# Patient Record
Sex: Male | Born: 1955 | Race: White | Hispanic: No | Marital: Married
Health system: Midwestern US, Community
[De-identification: ages and names within clinical notes are randomized; demographics above are authoritative.]

## PROBLEM LIST (undated history)

## (undated) DIAGNOSIS — S92901D Unspecified fracture of right foot, subsequent encounter for fracture with routine healing: Secondary | ICD-10-CM

## (undated) DIAGNOSIS — G4733 Obstructive sleep apnea (adult) (pediatric): Secondary | ICD-10-CM

## (undated) DIAGNOSIS — L578 Other skin changes due to chronic exposure to nonionizing radiation: Secondary | ICD-10-CM

## (undated) DIAGNOSIS — I10 Essential (primary) hypertension: Secondary | ICD-10-CM

## (undated) DIAGNOSIS — Z021 Encounter for pre-employment examination: Secondary | ICD-10-CM

## (undated) DIAGNOSIS — E785 Hyperlipidemia, unspecified: Secondary | ICD-10-CM

## (undated) DIAGNOSIS — E7800 Pure hypercholesterolemia, unspecified: Secondary | ICD-10-CM

## (undated) DIAGNOSIS — C61 Malignant neoplasm of prostate: Secondary | ICD-10-CM

## (undated) DIAGNOSIS — Z1211 Encounter for screening for malignant neoplasm of colon: Secondary | ICD-10-CM

## (undated) DIAGNOSIS — J342 Deviated nasal septum: Secondary | ICD-10-CM

## (undated) DIAGNOSIS — S92901K Unspecified fracture of right foot, subsequent encounter for fracture with nonunion: Secondary | ICD-10-CM

## (undated) DIAGNOSIS — Z77018 Contact with and (suspected) exposure to other hazardous metals: Secondary | ICD-10-CM

## (undated) DIAGNOSIS — F419 Anxiety disorder, unspecified: Secondary | ICD-10-CM

## (undated) MED ORDER — LOVASTATIN 20 MG TAB
20 mg | ORAL_TABLET | Freq: Every evening | ORAL | Status: DC
Start: ? — End: 2013-08-10

## (undated) MED ORDER — LOSARTAN 50 MG TAB
50 mg | ORAL_TABLET | Freq: Every day | ORAL | Status: DC
Start: ? — End: 2013-08-10

## (undated) MED ORDER — MOMETASONE 0.1 % TOPICAL CREAM
0.1 % | Freq: Two times a day (BID) | CUTANEOUS | Status: DC
Start: ? — End: 2013-05-01

## (undated) MED ORDER — ZOLPIDEM 10 MG TAB
10 mg | ORAL_TABLET | Freq: Every evening | ORAL | Status: DC | PRN
Start: ? — End: 2013-08-10

## (undated) MED ORDER — LOSARTAN 50 MG TAB
50 mg | ORAL_TABLET | Freq: Every day | ORAL | Status: DC
Start: ? — End: 2013-05-01

## (undated) MED ORDER — ZOLPIDEM 10 MG TAB
10 mg | ORAL_TABLET | Freq: Every evening | ORAL | Status: DC | PRN
Start: ? — End: 2013-05-01

## (undated) MED ORDER — MOMETASONE 0.1 % TOPICAL CREAM
0.1 % | Freq: Two times a day (BID) | CUTANEOUS | Status: DC
Start: ? — End: 2016-11-26

## (undated) MED ORDER — LOSARTAN 50 MG TAB
50 mg | ORAL_TABLET | Freq: Every day | ORAL | Status: DC
Start: ? — End: 2014-09-20

## (undated) MED ORDER — LOVASTATIN 20 MG TAB
20 mg | ORAL_TABLET | Freq: Every evening | ORAL | Status: DC
Start: ? — End: 2014-09-20

## (undated) MED ORDER — ZOLPIDEM 10 MG TAB
10 mg | ORAL_TABLET | Freq: Every evening | ORAL | Status: DC | PRN
Start: ? — End: 2014-09-20

---

## 2013-04-10 NOTE — Progress Notes (Signed)
HISTORY OF PRESENT ILLNESS  Willie Stewart is a 57 y.o. male.  Chief Complaint   Patient presents with   ??? New Patient     been out of [place] Would like to schedule a complete physical soon.   ??? Medication Refill   ??? Immunization/Injection     Delines Influenza Vaccine Today   ??? Cholesterol Problem     borderline Cholesterol- tried and failed Lipitor myalgia's.   ??? Anemia     iron levels have been low. Takes OTC Iron supplement.     HPI  Patient presents today for follow-up of medical care.  He works for [employer] and travels [place].  He essentially lived in 6 different areas in the last 10 years.  He has been placed on Lipitor in the past for hyperlipidemia and mild coronary artery disease.  He had a calcium score 5 in 2012.  Nonetheless he has moved to [place] in the last 3 weeks and just flew into town at 20 PM yesterday evening.  He states he does have some right shoulder pain with exercise and has not been exercising for the last few weeks due to this.  His right shoulder pain is improved with no exercise.  He travels a great deal and does have difficulty with sleeping.  He does have trouble sleeping takes Ambien.  This is usually once a week.    No chest pain.  No shortness of breath.  He remains quite active.    Past Medical History   Diagnosis Date   ??? Hypertension    ??? Insomnia    ??? Atopic dermatitis      Past Surgical History   Procedure Laterality Date   ??? Hx hernia repair  1979   ??? Hx colonoscopy  2006     benign      Family History   Problem Relation Age of Onset   ??? Cancer Mother      breast and Leukemia   ??? Heart Disease Father      CAD   ??? Hypertension Father    ??? Cancer Father      Prostate     History   Substance Use Topics   ??? Smoking status: Never Smoker    ??? Smokeless tobacco: Never Used   ??? Alcohol Use: Yes      Comment: socially      Prior to Admission medications    Medication Sig Start Date End Date Taking? Authorizing Provider   b complex vitamins  tablet Take 1 tablet by mouth daily.   Yes Historical Provider   ascorbic acid (VITAMIN C) 500 mg tablet Take 1,000 mg by mouth daily.   Yes Historical Provider   cholecalciferol, vitamin D3, (VITAMIN D3) 2,000 unit tab Take  by mouth.   Yes Historical Provider   Glucosam-Chon-Collag-Hyalur Ac 375-300-50-2 mg cap Take  by mouth daily.   Yes Historical Provider   red yeast rice extract 600 mg cap Take 600 mg by mouth now.   Yes Historical Provider   ferrous sulfate (IRON) 325 mg (65 mg iron) tablet Take  by mouth Daily (before breakfast).   Yes Historical Provider   OTHER Apple Pectin takes 1 tablet once daily   Yes Historical Provider   coenzyme q10-vitamin e (COQ10 SG 100) 100-100 mg-unit cap Take  by mouth.   Yes Historical Provider   losartan (COZAAR) 50 mg tablet Take 1 tablet by mouth daily. 04/10/13  Yes Charlynn Court., MD   mometasone (ELOCON) 0.1 % topical cream Apply  to affected area two (2) times a day. Indications: ATOPIC DERMATITIS 04/10/13  Yes Charlynn Court., MD   zolpidem [facility]) 10 mg tablet Take 1 tablet by mouth nightly as needed for Sleep. 04/10/13  Yes Charlynn Court., MD        No Known Allergies    Review of Systems   Constitutional: Positive for malaise/fatigue. Negative for fever and chills.   HENT: Negative for hearing loss, sore throat and neck pain.    Respiratory: Negative for cough and shortness of breath.    Cardiovascular: Negative for chest pain and leg swelling.   Gastrointestinal: Negative for heartburn, nausea and vomiting.   Genitourinary: Negative for dysuria.   Musculoskeletal: Negative for myalgias.   Skin: Negative for rash (infrequent eczema).   Neurological: Positive for focal weakness (right arm/shoulder since he had a biceps tendon rupture). Negative for headaches.       BP 114/80   Pulse 72   Ht 5' 7.5" (1.715 m)   Wt 164 lb (74.39 kg)   BMI 25.29 kg/m2    Physical Exam   Constitutional: He is oriented to person, place, and time. He appears  well-developed and well-nourished. No distress.   HENT:   Head: Normocephalic and atraumatic.   Right Ear: External ear normal.   Left Ear: External ear normal.   Eyes: Conjunctivae and EOM are normal. Pupils are equal, round, and reactive to light. No scleral icterus.   Neck: Normal range of motion. Neck supple. No JVD present. No thyromegaly present.   Cardiovascular: Normal rate, regular rhythm, normal heart sounds and intact distal pulses.  Exam reveals no gallop and no friction rub.    No murmur heard.  Pulmonary/Chest: Effort normal and breath sounds normal. No respiratory distress. He has no wheezes. He has no rales.   Abdominal: Soft. He exhibits no distension.   Musculoskeletal: Normal range of motion. He exhibits no edema and no tenderness.   Lymphadenopathy:     He has no cervical adenopathy.   Neurological: He is alert and oriented to person, place, and time. No cranial nerve deficit. He exhibits normal muscle tone. Coordination normal.   Skin: Skin is warm and dry. No rash noted. He is not diaphoretic. No erythema. No pallor.   Psychiatric: He has a normal mood and affect. His behavior is normal. Judgment and thought content normal.     Jun 14, 2011  Total Chol 224 , LDL 93, HDL 65  PSA elevated 1.01 ug/l  May 2014   Hbg 13.7      ASSESSMENT and PLAN    ICD-9-CM    1. HTN (hypertension) 401.9 losartan (COZAAR) 50 mg tablet     CBC WITH AUTOMATED DIFF     METABOLIC PANEL, COMPREHENSIVE     TSH, 3RD GENERATION     LIPID PANEL     PROSTATE SPECIFIC AG (PSA)     URINALYSIS, COMPLETE   2. Hyperlipemia 272.4 red yeast rice extract 600 mg cap     CBC WITH AUTOMATED DIFF     METABOLIC PANEL, COMPREHENSIVE     LIPID PANEL   3. Anemia 285.9 b complex vitamins tablet     ascorbic acid (VITAMIN C) 500 mg tablet     ferrous sulfate (IRON) 325 mg (65 mg iron) tablet     VITAMIN B12 & FOLATE     FERRITIN  TRANSFERRIN SATURATION   4. Sleep disorder, circadian, shift work type 327.36 zolpidem (AMBIEN) 10 mg tablet    5. CAD (coronary artery disease) 414.00 red yeast rice extract 600 mg cap   6. Vitamin D deficiency 268.9 cholecalciferol, vitamin D3, (VITAMIN D3) 2,000 unit tab   7. Arthritis 716.90 Glucosam-Chon-Collag-Hyalur Ac 375-300-50-2 mg cap     Encounter Diagnoses   Name Primary?   ??? HTN (hypertension) Yes   ??? Hyperlipemia    ??? Anemia    ??? Sleep disorder, circadian, shift work type    ??? CAD (coronary artery disease)    ??? Vitamin D deficiency    ??? Arthritis      Orders Placed This Encounter   ??? CBC WITH AUTOMATED DIFF   ??? METABOLIC PANEL, COMPREHENSIVE   ??? TSH, 3RD GENERATION   ??? LIPID PANEL   ??? PSA   ??? Vitamin B12 Folate   ??? Ferritin   ??? IRON PROFILE (TRANSFERRIN SATURATION)   ??? Urinalysis- Complete   ??? DISCONTD: losartan (COZAAR) 50 mg tablet   ??? DISCONTD: mometasone (ELOCON) 0.1 % topical cream   ??? DISCONTD: zolpidem (AMBIEN) 10 mg tablet   ??? b complex vitamins tablet   ??? ascorbic acid (VITAMIN C) 500 mg tablet   ??? cholecalciferol, vitamin D3, (VITAMIN D3) 2,000 unit tab   ??? Glucosam-Chon-Collag-Hyalur Ac 375-300-50-2 mg cap   ??? red yeast rice extract 600 mg cap   ??? ferrous sulfate (IRON) 325 mg (65 mg iron) tablet   ??? OTHER   ??? coenzyme q10-vitamin e (COQ10 SG 100) 100-100 mg-unit cap   ??? losartan (COZAAR) 50 mg tablet   ??? mometasone (ELOCON) 0.1 % topical cream   ??? zolpidem (AMBIEN) 10 mg tablet     Nassim was seen today for new patient, medication refill, immunization/injection, cholesterol problem and anemia.    Diagnoses and associated orders for this visit:    HTN (hypertension)  - losartan (COZAAR) 50 mg tablet; Take 1 tablet by mouth daily.  - CBC WITH AUTOMATED DIFF; Future  - METABOLIC PANEL, COMPREHENSIVE; Future  - TSH, 3RD GENERATION; Future  - LIPID PANEL; Future  - PSA; Future  - Urinalysis- Complete; Future    Hyperlipemia  - red yeast rice extract 600 mg cap; Take 600 mg by mouth now.  - CBC WITH AUTOMATED DIFF; Future  - METABOLIC PANEL, COMPREHENSIVE; Future  - LIPID PANEL; Future    Anemia  - b  complex vitamins tablet; Take 1 tablet by mouth daily.  - ascorbic acid (VITAMIN C) 500 mg tablet; Take 1,000 mg by mouth daily.  - ferrous sulfate (IRON) 325 mg (65 mg iron) tablet; Take  by mouth Daily (before breakfast).  - Vitamin B12 Folate; Future  - Ferritin; Future  - IRON PROFILE (TRANSFERRIN SATURATION); Future    Sleep disorder, circadian, shift work type  - zolpidem (AMBIEN) 10 mg tablet; Take 1 tablet by mouth nightly as needed for Sleep.    CAD (coronary artery disease)  - red yeast rice extract 600 mg cap; Take 600 mg by mouth now.    Vitamin D deficiency  - cholecalciferol, vitamin D3, (VITAMIN D3) 2,000 unit tab; Take  by mouth.    Arthritis  - Glucosam-Chon-Collag-Hyalur Ac 375-300-50-2 mg cap; Take  by mouth daily.    Other Orders  - Discontinue: losartan (COZAAR) 50 mg tablet; Take  by mouth daily.  - Discontinue: mometasone (ELOCON) 0.1 % topical cream; Apply  to affected area two (2)  times a day. Indications: ATOPIC DERMATITIS  - Discontinue: zolpidem (AMBIEN) 10 mg tablet; Take  by mouth nightly as needed for Sleep.  - OTHER; Apple Pectin takes 1 tablet once daily  - coenzyme q10-vitamin e (COQ10 SG 100) 100-100 mg-unit cap; Take  by mouth.  - mometasone (ELOCON) 0.1 % topical cream; Apply  to affected area two (2) times a day. Indications: ATOPIC DERMATITIS        Follow-up Disposition: Not on File

## 2013-04-13 LAB — METABOLIC PANEL, COMPREHENSIVE
A-G Ratio: 2 (ref 1.1–2.5)
ALT (SGPT): 23 IU/L (ref 0–44)
AST (SGOT): 20 IU/L (ref 0–40)
Albumin: 4.6 g/dL (ref 3.5–5.5)
Alk. phosphatase: 43 IU/L (ref 39–117)
BUN/Creatinine ratio: 19 (ref 9–20)
BUN: 23 mg/dL (ref 6–24)
Bilirubin, total: 0.6 mg/dL (ref 0.0–1.2)
CO2: 26 mmol/L (ref 18–29)
Calcium: 9.2 mg/dL (ref 8.7–10.2)
Chloride: 99 mmol/L (ref 97–108)
Creatinine: 1.2 mg/dL (ref 0.76–1.27)
GFR est AA: 77 mL/min/{1.73_m2} (ref >59)
GFR est non-AA: 66 mL/min/{1.73_m2} (ref >59)
GLOBULIN, TOTAL: 2.3 g/dL (ref 1.5–4.5)
Glucose: 90 mg/dL (ref 65–99)
Potassium: 4.4 mmol/L (ref 3.5–5.2)
Protein, total: 6.9 g/dL (ref 6.0–8.5)
Sodium: 139 mmol/L (ref 134–144)

## 2013-04-13 LAB — CBC WITH AUTOMATED DIFF
ABS. BASOPHILS: 0 10*3/uL (ref 0.0–0.2)
ABS. EOSINOPHILS: 0.1 10*3/uL (ref 0.0–0.4)
ABS. IMM. GRANS.: 0 10*3/uL (ref 0.0–0.1)
ABS. MONOCYTES: 0.5 10*3/uL (ref 0.1–0.9)
ABS. NEUTROPHILS: 2.9 10*3/uL (ref 1.4–7.0)
Abs Lymphocytes: 1.8 10*3/uL (ref 0.7–3.1)
BASOPHILS: 0 %
EOSINOPHILS: 2 %
HCT: 42.4 % (ref 37.5–51.0)
HGB: 13.8 g/dL (ref 12.6–17.7)
IMMATURE GRANULOCYTES: 0 %
Lymphocytes: 33 %
MCH: 30.1 pg (ref 26.6–33.0)
MCHC: 32.5 g/dL (ref 31.5–35.7)
MCV: 93 fL (ref 79–97)
MONOCYTES: 10 %
NEUTROPHILS: 55 %
PLATELET: 193 10*3/uL (ref 150–379)
RBC: 4.58 x10E6/uL (ref 4.14–5.80)
RDW: 13.7 % (ref 12.3–15.4)
WBC: 5.3 10*3/uL (ref 3.4–10.8)

## 2013-04-13 LAB — VITAMIN D, 25 HYDROXY: VITAMIN D, 25-HYDROXY: 37.1 ng/mL (ref 30.0–100.0)

## 2013-04-13 LAB — TSH 3RD GENERATION: TSH: 2.36 u[IU]/mL (ref 0.450–4.500)

## 2013-04-13 LAB — LIPID PANEL
Cholesterol, total: 252 mg/dL — ABNORMAL HIGH (ref 100–199)
HDL Cholesterol: 69 mg/dL (ref >39)
LDL, calculated: 147 mg/dL — ABNORMAL HIGH (ref 0–99)
Triglyceride: 179 mg/dL — ABNORMAL HIGH (ref 0–149)
VLDL, calculated: 36 mg/dL (ref 5–40)

## 2013-04-13 LAB — PSA, DIAGNOSTIC (PROSTATE SPECIFIC AG): Prostate Specific Ag: 2.5 ng/mL (ref 0.0–4.0)

## 2013-04-13 LAB — FERRITIN: Ferritin: 514 ng/mL — ABNORMAL HIGH (ref 30–400)

## 2013-04-13 LAB — CVD REPORT: PDF IMAGE: 0

## 2013-04-13 LAB — VITAMIN B12 & FOLATE
Folate: 18.2 ng/mL (ref >3.0)
Vitamin B12: 782 pg/mL (ref 211–946)

## 2013-05-01 NOTE — Patient Instructions (Addendum)
Results for orders placed in visit on 04/12/13   CBC WITH AUTOMATED DIFF       Result Value Range    WBC 5.3  3.4 - 10.8 x10E3/uL    RBC 4.58  4.14 - 5.80 x10E6/uL    HGB 13.8  12.6 - 17.7 g/dL    HCT 16.1  09.6 - 04.5 %    MCV 93  79 - 97 fL    MCH 30.1  26.6 - 33.0 pg    MCHC 32.5  31.5 - 35.7 g/dL    RDW 40.9  81.1 - 91.4 %    PLATELET 193  150 - 379 x10E3/uL    NEUTROPHILS 55      Lymphocytes 33      MONOCYTES 10      EOSINOPHILS 2      BASOPHILS 0      ABS. NEUTROPHILS 2.9  1.4 - 7.0 x10E3/uL    Abs Lymphocytes 1.8  0.7 - 3.1 x10E3/uL    ABS. MONOCYTES 0.5  0.1 - 0.9 x10E3/uL    ABS. EOSINOPHILS 0.1  0.0 - 0.4 x10E3/uL    ABS. BASOPHILS 0.0  0.0 - 0.2 x10E3/uL    IMMATURE GRANULOCYTES 0      ABS. IMM. GRANS. 0.0  0.0 - 0.1 x10E3/uL   METABOLIC PANEL, COMPREHENSIVE       Result Value Range    Glucose 90  65 - 99 mg/dL    BUN 23  6 - 24 mg/dL    Creatinine 7.82  9.56 - 1.27 mg/dL    GFR est non-AA 66  >21 mL/min/1.73    GFR est AA 77  >59 mL/min/1.73    BUN/Creatinine ratio 19  9 - 20    Sodium 139  134 - 144 mmol/L    Potassium 4.4  3.5 - 5.2 mmol/L    Chloride 99  97 - 108 mmol/L    CO2 26  18 - 29 mmol/L    Calcium 9.2  8.7 - 10.2 mg/dL    Protein, total 6.9  6.0 - 8.5 g/dL    Albumin 4.6  3.5 - 5.5 g/dL    GLOBULIN, TOTAL 2.3  1.5 - 4.5 g/dL    A-G Ratio 2.0  1.1 - 2.5    Bilirubin, total 0.6  0.0 - 1.2 mg/dL    Alk. phosphatase 43  39 - 117 IU/L    AST 20  0 - 40 IU/L    ALT 23  0 - 44 IU/L   TSH, 3RD GENERATION       Result Value Range    TSH 2.360  0.450 - 4.500 uIU/mL   LIPID PANEL       Result Value Range    Cholesterol, total 252 (*) 100 - 199 mg/dL    Triglyceride 308 (*) 0 - 149 mg/dL    HDL Cholesterol 69  >39 mg/dL    VLDL, calculated 36  5 - 40 mg/dL    LDL, calculated 657 (*) 0 - 99 mg/dL   PROSTATE SPECIFIC AG (PSA)       Result Value Range    Prostate Specific Ag 2.5  0.0 - 4.0 ng/mL   VITAMIN B12 & FOLATE       Result Value Range    Vitamin B12 782  211 - 946 pg/mL    Folate 18.2  >3.0 ng/mL    FERRITIN       Result Value Range    Ferritin 514 (*)  30 - 400 ng/mL   VITAMIN D, 25 HYDROXY       Result Value Range    VITAMIN D, 25-HYDROXY 37.1  30.0 - 100.0 ng/mL   CVD REPORT       Result Value Range    INTERPRETATION Note      PDF IMAGE .           Thrusters light weight.    Push-ups hands off the ground.  Upright rows.  Lat pull down to front.    External rotations.      Labs work in 3 months, 1 week before appt.

## 2013-05-01 NOTE — Progress Notes (Signed)
HISTORY OF PRESENT ILLNESS  Willie Stewart is a 57 y.o. male.  Chief Complaint   Patient presents with   ??? Complete Physical     no compliants/ needs Form for work in able to work out.   ??? Immunization/Injection     Declines Influenza Vaccine today     HPI  Patient comes in today for yearly physical.  He is doing well.  His right shoulder is still problematic but since he is not working out it is feeling less painful.  He is now scheduled to fly to [place] in the coming months.  No chest pain.  No sob.        Past Medical History   Diagnosis Date   ??? Hypertension    ??? Insomnia    ??? Atopic dermatitis      Past Surgical History   Procedure Laterality Date   ??? Hx hernia repair  1979   ??? Hx colonoscopy  2006     benign      Family History   Problem Relation Age of Onset   ??? Cancer Mother      breast and Leukemia   ??? Heart Disease Father      CAD   ??? Hypertension Father    ??? Cancer Father      Prostate     History   Substance Use Topics   ??? Smoking status: Never Smoker    ??? Smokeless tobacco: Never Used   ??? Alcohol Use: Yes      Comment: socially      Prior to Admission medications    Medication Sig Start Date End Date Taking? Authorizing Provider   mometasone (ELOCON) 0.1 % topical cream Apply  to affected area two (2) times a day. Indications: ATOPIC DERMATITIS 05/01/13  Yes Charlynn Court., MD   zolpidem [facility]) 10 mg tablet Take 1 tablet by mouth nightly as needed for Sleep. 05/01/13  Yes Charlynn Court., MD   losartan (COZAAR) 50 mg tablet Take 1 tablet by mouth daily. 05/01/13  Yes Charlynn Court., MD   lovastatin (MEVACOR) 20 mg tablet Take 1 tablet by mouth nightly. 05/01/13  Yes Charlynn Court., MD   b complex vitamins tablet Take 1 tablet by mouth daily.   Yes Historical Provider   ascorbic acid (VITAMIN C) 500 mg tablet Take 1,000 mg by mouth daily.   Yes Historical Provider   cholecalciferol, vitamin D3, (VITAMIN D3) 2,000 unit tab Take  by mouth.   Yes Historical Provider    Glucosam-Chon-Collag-Hyalur Ac 375-300-50-2 mg cap Take  by mouth daily.   Yes Historical Provider   ferrous sulfate (IRON) 325 mg (65 mg iron) tablet Take  by mouth Daily (before breakfast).   Yes Historical Provider   OTHER Apple Pectin takes 1 tablet once daily   Yes Historical Provider   coenzyme q10-vitamin e (COQ10 SG 100) 100-100 mg-unit cap Take  by mouth.   Yes Historical Provider        No Known Allergies      Review of Systems   Constitutional: Negative for fever and weight loss.   Respiratory: Negative for cough and shortness of breath.    Cardiovascular: Negative for chest pain and palpitations.   Gastrointestinal: Negative for heartburn, nausea and vomiting.   Genitourinary: Negative for dysuria.   Musculoskeletal: Positive for joint pain. Negative for myalgias and back pain.   Skin: Negative for rash.   Neurological:  Negative for dizziness and headaches.   Psychiatric/Behavioral: Negative for depression.       BP 110/80   Pulse 60   Ht 5' 7.5" (1.715 m)   Wt 163 lb (73.936 kg)   BMI 25.14 kg/m2    Physical Exam   Nursing note and vitals reviewed.  Constitutional: He is oriented to person, place, and time. He appears well-developed and well-nourished. No distress.   HENT:   Head: Normocephalic and atraumatic.   Mouth/Throat: Oropharynx is clear and moist. No oropharyngeal exudate.   Eyes: Conjunctivae are normal. Pupils are equal, round, and reactive to light. Right eye exhibits no discharge. Left eye exhibits no discharge. No scleral icterus.   Neck: Normal range of motion. Neck supple. No tracheal deviation present. No thyromegaly present.   Cardiovascular: Normal rate, regular rhythm, normal heart sounds and intact distal pulses.  Exam reveals no gallop and no friction rub.    No murmur heard.  Pulmonary/Chest: Effort normal. No respiratory distress. He has no wheezes. He has no rales.   Abdominal: Soft. Bowel sounds are normal. He exhibits no distension. There is no tenderness. There is no  rebound.   Genitourinary: Rectum normal, prostate normal and penis normal. Guaiac negative stool.   Musculoskeletal: Normal range of motion. He exhibits no edema.        Arms:  Deformity of the right shoulder.   Lymphadenopathy:     He has no cervical adenopathy.   Neurological: He is alert and oriented to person, place, and time. He has normal reflexes. No cranial nerve deficit. Coordination normal.   Skin: Skin is warm and dry. He is not diaphoretic.   Psychiatric: He has a normal mood and affect. His behavior is normal. Judgment and thought content normal.         Results for orders placed in visit on 04/12/13   CBC WITH AUTOMATED DIFF       Result Value Range    WBC 5.3  3.4 - 10.8 x10E3/uL    RBC 4.58  4.14 - 5.80 x10E6/uL    HGB 13.8  12.6 - 17.7 g/dL    HCT 96.0  45.4 - 09.8 %    MCV 93  79 - 97 fL    MCH 30.1  26.6 - 33.0 pg    MCHC 32.5  31.5 - 35.7 g/dL    RDW 11.9  14.7 - 82.9 %    PLATELET 193  150 - 379 x10E3/uL    NEUTROPHILS 55      Lymphocytes 33      MONOCYTES 10      EOSINOPHILS 2      BASOPHILS 0      ABS. NEUTROPHILS 2.9  1.4 - 7.0 x10E3/uL    Abs Lymphocytes 1.8  0.7 - 3.1 x10E3/uL    ABS. MONOCYTES 0.5  0.1 - 0.9 x10E3/uL    ABS. EOSINOPHILS 0.1  0.0 - 0.4 x10E3/uL    ABS. BASOPHILS 0.0  0.0 - 0.2 x10E3/uL    IMMATURE GRANULOCYTES 0      ABS. IMM. GRANS. 0.0  0.0 - 0.1 x10E3/uL   METABOLIC PANEL, COMPREHENSIVE       Result Value Range    Glucose 90  65 - 99 mg/dL    BUN 23  6 - 24 mg/dL    Creatinine 5.62  1.30 - 1.27 mg/dL    GFR est non-AA 66  >86 mL/min/1.73    GFR est AA 77  >59 mL/min/1.73  BUN/Creatinine ratio 19  9 - 20    Sodium 139  134 - 144 mmol/L    Potassium 4.4  3.5 - 5.2 mmol/L    Chloride 99  97 - 108 mmol/L    CO2 26  18 - 29 mmol/L    Calcium 9.2  8.7 - 10.2 mg/dL    Protein, total 6.9  6.0 - 8.5 g/dL    Albumin 4.6  3.5 - 5.5 g/dL    GLOBULIN, TOTAL 2.3  1.5 - 4.5 g/dL    A-G Ratio 2.0  1.1 - 2.5    Bilirubin, total 0.6  0.0 - 1.2 mg/dL    Alk. phosphatase 43  39 - 117 IU/L     AST 20  0 - 40 IU/L    ALT 23  0 - 44 IU/L   TSH, 3RD GENERATION       Result Value Range    TSH 2.360  0.450 - 4.500 uIU/mL   LIPID PANEL       Result Value Range    Cholesterol, total 252 (*) 100 - 199 mg/dL    Triglyceride 161 (*) 0 - 149 mg/dL    HDL Cholesterol 69  >39 mg/dL    VLDL, calculated 36  5 - 40 mg/dL    LDL, calculated 096 (*) 0 - 99 mg/dL   PROSTATE SPECIFIC AG (PSA)       Result Value Range    Prostate Specific Ag 2.5  0.0 - 4.0 ng/mL   VITAMIN B12 & FOLATE       Result Value Range    Vitamin B12 782  211 - 946 pg/mL    Folate 18.2  >3.0 ng/mL   FERRITIN       Result Value Range    Ferritin 514 (*) 30 - 400 ng/mL   VITAMIN D, 25 HYDROXY       Result Value Range    VITAMIN D, 25-HYDROXY 37.1  30.0 - 100.0 ng/mL   CVD REPORT       Result Value Range    INTERPRETATION Note      PDF IMAGE .       EKG: sinus bradycardia, rate 58 bpm, normal axis.      ASSESSMENT and PLAN    ICD-9-CM    1. Routine general medical examination at a health care facility V70.0 AMB POC EKG ROUTINE W/ 12 LEADS, INTER & REP     AMB POC FECAL BLOOD, OCCULT, QL 1 CARD   2. Anemia 285.9 AMB POC FECAL BLOOD, OCCULT, QL 1 CARD   3. Sleep disorder, circadian, shift work type 327.36 zolpidem (AMBIEN) 10 mg tablet   4. HTN (hypertension) 401.9 losartan (COZAAR) 50 mg tablet   5. Hyperlipemia 272.4 lovastatin (MEVACOR) 20 mg tablet     LIPID PANEL     Encounter Diagnoses   Name Primary?   ??? Routine general medical examination at a health care facility Yes   ??? Anemia    ??? Sleep disorder, circadian, shift work type    ??? HTN (hypertension)    ??? Hyperlipemia      Orders Placed This Encounter   ??? LIPID PANEL   ??? Hemoccult, Single Specimen   ??? EKG w/ 12 leads, Inter_rep   ??? mometasone (ELOCON) 0.1 % topical cream   ??? zolpidem (AMBIEN) 10 mg tablet   ??? losartan (COZAAR) 50 mg tablet   ??? lovastatin (MEVACOR) 20 mg tablet     Willie Stewart was seen  today for complete physical and immunization/injection.    Diagnoses and associated orders for this  visit:    Routine general medical examination at a health care facility  - EKG w/ 12 leads, Inter_rep  - Hemoccult, Single Specimen    Anemia  - Hemoccult, Single Specimen    Sleep disorder, circadian, shift work type  - zolpidem (AMBIEN) 10 mg tablet; Take 1 tablet by mouth nightly as needed for Sleep.    HTN (hypertension)  - losartan (COZAAR) 50 mg tablet; Take 1 tablet by mouth daily.    Hyperlipemia  - lovastatin (MEVACOR) 20 mg tablet; Take 1 tablet by mouth nightly.  - LIPID PANEL; Future    Other Orders  - mometasone (ELOCON) 0.1 % topical cream; Apply  to affected area two (2) times a day. Indications: ATOPIC DERMATITIS        Follow-up Disposition:  Return in about 3 months (around 08/01/2013).    We discussed the laboratory values.  He has had significant problems with statin intolerance but is tolerating Red Yeast Rice which has on average less than 5 mg of lovastatin.  Will need statin for secondary prevention.  I gave some exercises to help with his shoulder.  Consider physical therapy.    Recheck cholesterol in 3 months on Mevacor 20 mg alone.  (Was on Lipitor in past with poor tolerance.)

## 2013-08-01 ENCOUNTER — Encounter

## 2013-08-03 ENCOUNTER — Telehealth

## 2013-08-03 LAB — URINALYSIS W/ RFLX MICROSCOPIC
Bilirubin: NEGATIVE
Blood: NEGATIVE
Glucose: NEGATIVE
Ketone: NEGATIVE
Leukocyte Esterase: NEGATIVE
Nitrites: NEGATIVE
Protein: NEGATIVE
Specific Gravity: 1.008 (ref 1.005–1.030)
Urobilinogen: 0.2 mg/dL (ref 0.0–1.9)
pH (UA): 6.5 (ref 5.0–7.5)

## 2013-08-03 LAB — CBC WITH AUTOMATED DIFF
ABS. BASOPHILS: 0 10*3/uL (ref 0.0–0.2)
ABS. EOSINOPHILS: 0 10*3/uL (ref 0.0–0.4)
ABS. IMM. GRANS.: 0 10*3/uL (ref 0.0–0.1)
ABS. MONOCYTES: 0.5 10*3/uL (ref 0.1–0.9)
ABS. NEUTROPHILS: 2.9 10*3/uL (ref 1.4–7.0)
Abs Lymphocytes: 1.5 10*3/uL (ref 0.7–3.1)
BASOPHILS: 1 %
EOSINOPHILS: 0 %
HCT: 40 % (ref 37.5–51.0)
HGB: 13.8 g/dL (ref 12.6–17.7)
IMMATURE GRANULOCYTES: 0 %
Lymphocytes: 31 %
MCH: 30.5 pg (ref 26.6–33.0)
MCHC: 34.5 g/dL (ref 31.5–35.7)
MCV: 89 fL (ref 79–97)
MONOCYTES: 10 %
NEUTROPHILS: 58 %
PLATELET: 204 10*3/uL (ref 150–379)
RBC: 4.52 x10E6/uL (ref 4.14–5.80)
RDW: 13.6 % (ref 12.3–15.4)
WBC: 5 10*3/uL (ref 3.4–10.8)

## 2013-08-03 LAB — METABOLIC PANEL, COMPREHENSIVE
A-G Ratio: 2.5 (ref 1.1–2.5)
ALT (SGPT): 29 IU/L (ref 0–44)
AST (SGOT): 30 IU/L (ref 0–40)
Albumin: 4.5 g/dL (ref 3.5–5.5)
Alk. phosphatase: 42 IU/L (ref 39–117)
BUN/Creatinine ratio: 15 (ref 9–20)
BUN: 17 mg/dL (ref 6–24)
Bilirubin, total: 0.9 mg/dL (ref 0.0–1.2)
CO2: 26 mmol/L (ref 18–29)
Calcium: 9 mg/dL (ref 8.7–10.2)
Chloride: 102 mmol/L (ref 97–108)
Creatinine: 1.1 mg/dL (ref 0.76–1.27)
GFR est AA: 85 mL/min/{1.73_m2} (ref >59)
GFR est non-AA: 74 mL/min/{1.73_m2} (ref >59)
GLOBULIN, TOTAL: 1.8 g/dL (ref 1.5–4.5)
Glucose: 94 mg/dL (ref 65–99)
Potassium: 5.1 mmol/L (ref 3.5–5.2)
Protein, total: 6.3 g/dL (ref 6.0–8.5)
Sodium: 140 mmol/L (ref 134–144)

## 2013-08-03 LAB — CVD REPORT: PDF IMAGE: 0

## 2013-08-03 LAB — LIPID PANEL
Cholesterol, total: 199 mg/dL (ref 100–199)
HDL Cholesterol: 74 mg/dL (ref >39)
LDL, calculated: 107 mg/dL — ABNORMAL HIGH (ref 0–99)
Triglyceride: 90 mg/dL (ref 0–149)
VLDL, calculated: 18 mg/dL (ref 5–40)

## 2013-08-03 NOTE — Telephone Encounter (Signed)
Patient notified and he stated we can use the the EKG from October. He will go to have a chest X-Ray soon.

## 2013-08-03 NOTE — Telephone Encounter (Signed)
For pre-employment exam please obtain a chest xray.  Ordered.    Also, if you could see an Optometrist ( or ophthalmologist) so that I can fill out the vision section I would appreciate that.      We will order the EKG and due spirometry when you come in.

## 2013-08-03 NOTE — Progress Notes (Signed)
Quick Note:    I will review the labs at the upcoming appointment.  ______

## 2013-08-04 NOTE — Progress Notes (Signed)
Quick Note:    I will review the labs at the upcoming appointment.  ______

## 2013-08-07 NOTE — Progress Notes (Signed)
Quick Note:    I will review the labs at the upcoming appointment.  ______

## 2013-08-10 NOTE — Progress Notes (Signed)
HISTORY OF PRESENT ILLNESS  Willie Stewart is a 58 y.o. male.  Chief Complaint   Patient presents with   ??? Follow-up     3 month, discuss labs drawn 08-02-13   ??? Medication Refill     need 6 month supply since traveling to [place] for 18 months for business, leaving late Feb. 2015   ??? Immunization/Injection     questions about recommended current vaccinations for travel to [place]      HPI  Patient here for pre-international travel physical..    I filled out forms for his work at [employer] and travel abroad.      He denies any chest pain.  No shortness of breath.      He recently purchased a town home in subdivision of [place].      Review of Systems   Constitutional: Negative for fever and chills.   Respiratory: Negative for cough and shortness of breath.    Cardiovascular: Negative for chest pain.   Gastrointestinal: Negative for heartburn, nausea and vomiting.   Genitourinary: Negative for dysuria.   Musculoskeletal: Positive for myalgias (Mild since being on the Mevacor but noticable) and joint pain (Mild since being on the Mevacor but noticable).       BP 132/80   Pulse 72   Temp(Src) 98.5 ??F (36.9 ??C) (Oral)   Resp 16   Ht 5' 7.5" (1.715 m)   Wt 164 lb (74.39 kg)   BMI 25.29 kg/m2    Physical Exam   Nursing note and vitals reviewed.  Constitutional: He is oriented to person, place, and time. He appears well-developed and well-nourished. No distress.   HENT:   Head: Normocephalic and atraumatic.   Mouth/Throat: No oropharyngeal exudate.   Eyes: Conjunctivae are normal. Pupils are equal, round, and reactive to light. No scleral icterus.   Neck: Normal range of motion. Neck supple. No JVD present. No thyromegaly present.   Cardiovascular: Normal rate, regular rhythm, normal heart sounds and intact distal pulses.  Exam reveals no gallop and no friction rub.    No murmur heard.  Pulmonary/Chest: Effort normal and breath sounds normal. No respiratory distress. He has no wheezes.   Abdominal: Soft. Bowel sounds are  normal. He exhibits no distension. There is no tenderness. There is no rebound and no guarding.   Musculoskeletal: Normal range of motion. He exhibits no edema and no tenderness.   Lymphadenopathy:     He has no cervical adenopathy.   Neurological: He is alert and oriented to person, place, and time. No cranial nerve deficit.   Skin: Skin is warm and dry. He is not diaphoretic.   Psychiatric: He has a normal mood and affect. His behavior is normal. Thought content normal.       Results for orders placed in visit on 08/02/13   LIPID PANEL       Result Value Range    Cholesterol, total 199  100 - 199 mg/dL    Triglyceride 90  0 - 149 mg/dL    HDL Cholesterol 74  >39 mg/dL    VLDL, calculated 18  5 - 40 mg/dL    LDL, calculated 107 (*) 0 - 99 mg/dL   CBC WITH AUTOMATED DIFF       Result Value Range    WBC 5.0  3.4 - 10.8 x10E3/uL    RBC 4.52  4.14 - 5.80 x10E6/uL    HGB 13.8  12.6 - 17.7 g/dL    HCT 40.0  37.5 -  51.0 %    MCV 89  79 - 97 fL    MCH 30.5  26.6 - 33.0 pg    MCHC 34.5  31.5 - 35.7 g/dL    RDW 13.6  12.3 - 15.4 %    PLATELET 204  150 - 379 x10E3/uL    NEUTROPHILS 58      Lymphocytes 31      MONOCYTES 10      EOSINOPHILS 0      BASOPHILS 1      ABS. NEUTROPHILS 2.9  1.4 - 7.0 x10E3/uL    Abs Lymphocytes 1.5  0.7 - 3.1 x10E3/uL    ABS. MONOCYTES 0.5  0.1 - 0.9 x10E3/uL    ABS. EOSINOPHILS 0.0  0.0 - 0.4 x10E3/uL    ABS. BASOPHILS 0.0  0.0 - 0.2 x10E3/uL    IMMATURE GRANULOCYTES 0      ABS. IMM. GRANS. 0.0  0.0 - 0.1 x10E3/uL   URINALYSIS W/ RFLX MICROSCOPIC       Result Value Range    Specific Gravity 1.008  1.005 - 1.030    pH (UA) 6.5  5.0 - 7.5    Color Yellow  Yellow    Appearance Clear  Clear    Leukocyte Esterase Negative  Negative    Protein Negative  Negative/Trace    Glucose Negative  Negative    Ketone Negative  Negative    Blood Negative  Negative    Bilirubin Negative  Negative    Urobilinogen 0.2  0.0 - 1.9 mg/dL    Nitrites Negative  Negative    Microscopic Examination Comment     METABOLIC  PANEL, COMPREHENSIVE       Result Value Range    Glucose 94  65 - 99 mg/dL    BUN 17  6 - 24 mg/dL    Creatinine 1.10  0.76 - 1.27 mg/dL    GFR est non-AA 74  >59 mL/min/1.73    GFR est AA 85  >59 mL/min/1.73    BUN/Creatinine ratio 15  9 - 20    Sodium 140  134 - 144 mmol/L    Potassium 5.1  3.5 - 5.2 mmol/L    Chloride 102  97 - 108 mmol/L    CO2 26  18 - 29 mmol/L    Calcium 9.0  8.7 - 10.2 mg/dL    Protein, total 6.3  6.0 - 8.5 g/dL    Albumin 4.5  3.5 - 5.5 g/dL    GLOBULIN, TOTAL 1.8  1.5 - 4.5 g/dL    A-G Ratio 2.5  1.1 - 2.5    Bilirubin, total 0.9  0.0 - 1.2 mg/dL    Alk. phosphatase 42  39 - 117 IU/L    AST 30  0 - 40 IU/L    ALT 29  0 - 44 IU/L   CVD REPORT       Result Value Range    INTERPRETATION Note      PDF IMAGE .       XR Results (most recent):    Results from Hospital Encounter encounter on 08/04/13   XR CHEST PA LAT   Narrative PA LATERAL CHEST X-RAY    HISTORY: Preemployment physical. Hypertension.    COMPARISON: None.    FINDINGS: The heart size is normal. There is no lobar consolidation, pleural  effusions or pulmonary edema.         Impression IMPRESSION: No acute findings.        Spirometry  FEV 1 / FVC = 72%  FEV 1 =  3.41 L  (101%)  FVC =  4.76 L  (108%)      ASSESSMENT and PLAN    ICD-9-CM    1. Routine general medical examination at a health care facility V70.0 AMB POC SPIROMETRY   2. Sleep disorder, circadian, shift work type 327.36 zolpidem (AMBIEN) 10 mg tablet     DISCONTINUED: zolpidem (AMBIEN) 10 mg tablet   3. HTN (hypertension) 401.9 losartan (COZAAR) 50 mg tablet     DISCONTINUED: losartan (COZAAR) 50 mg tablet   4. Hyperlipemia 272.4 lovastatin (MEVACOR) 20 mg tablet     DISCONTINUED: lovastatin (MEVACOR) 20 mg tablet     DISCONTINUED: lovastatin (MEVACOR) 20 mg tablet     Encounter Diagnoses   Name Primary?   ??? Routine general medical examination at a health care facility Yes   ??? Sleep disorder, circadian, shift work type    ??? HTN (hypertension)    ??? Hyperlipemia      Orders  Placed This Encounter   ??? Spirometry   ??? DISCONTD: zolpidem (AMBIEN) 10 mg tablet   ??? DISCONTD: losartan (COZAAR) 50 mg tablet   ??? DISCONTD: lovastatin (MEVACOR) 20 mg tablet   ??? zolpidem (AMBIEN) 10 mg tablet   ??? DISCONTD: lovastatin (MEVACOR) 20 mg tablet   ??? losartan (COZAAR) 50 mg tablet   ??? lovastatin (MEVACOR) 20 mg tablet     Willie Stewart was seen today for follow-up, medication refill and immunization/injection.    Diagnoses and associated orders for this visit:    Routine general medical examination at a health care facility  - Spirometry    Sleep disorder, circadian, shift work type  - Discontinue: zolpidem (AMBIEN) 10 mg tablet; Take 1 tablet by mouth nightly as needed for Sleep.  - zolpidem (AMBIEN) 10 mg tablet; Take 1 tablet by mouth nightly as needed for Sleep.    HTN (hypertension)  - Discontinue: losartan (COZAAR) 50 mg tablet; Take 1 tablet by mouth daily.  - losartan (COZAAR) 50 mg tablet; Take 1 tablet by mouth daily.    Hyperlipemia  - Discontinue: lovastatin (MEVACOR) 20 mg tablet; Take 1 tablet by mouth nightly.  - Discontinue: lovastatin (MEVACOR) 20 mg tablet; Take 1 tablet by mouth nightly.  - lovastatin (MEVACOR) 20 mg tablet; Take 1 tablet by mouth nightly.        Follow-up Disposition: Not on File    Side effects of the Mevacor are tolerable.  Continue to monitor.    Healthy to travel [place] to work.

## 2013-08-11 NOTE — Progress Notes (Signed)
Quick Note:    Results reviewed during office visit.  ______

## 2014-04-10 LAB — METABOLIC PANEL, COMPREHENSIVE
A-G Ratio: 1.9 (ref 1.1–2.5)
ALT (SGPT): 23 IU/L (ref 0–44)
AST (SGOT): 18 IU/L (ref 0–40)
Albumin: 4.6 g/dL (ref 3.5–5.5)
Alk. phosphatase: 46 IU/L (ref 39–117)
BUN/Creatinine ratio: 15 (ref 9–20)
BUN: 17 mg/dL (ref 6–24)
Bilirubin, total: 0.9 mg/dL (ref 0.0–1.2)
CO2: 25 mmol/L (ref 18–29)
Calcium: 9.5 mg/dL (ref 8.7–10.2)
Chloride: 102 mmol/L (ref 97–108)
Creatinine: 1.13 mg/dL (ref 0.76–1.27)
GFR est AA: 82 mL/min/{1.73_m2} (ref >59)
GFR est non-AA: 71 mL/min/{1.73_m2} (ref >59)
GLOBULIN, TOTAL: 2.4 g/dL (ref 1.5–4.5)
Glucose: 101 mg/dL — ABNORMAL HIGH (ref 65–99)
Potassium: 4.6 mmol/L (ref 3.5–5.2)
Protein, total: 7 g/dL (ref 6.0–8.5)
Sodium: 142 mmol/L (ref 134–144)

## 2014-04-10 LAB — CBC WITH AUTOMATED DIFF
ABS. BASOPHILS: 0 10*3/uL (ref 0.0–0.2)
ABS. EOSINOPHILS: 0 10*3/uL (ref 0.0–0.4)
ABS. IMM. GRANS.: 0 10*3/uL (ref 0.0–0.1)
ABS. MONOCYTES: 0.8 10*3/uL (ref 0.1–0.9)
ABS. NEUTROPHILS: 4.6 10*3/uL (ref 1.4–7.0)
Abs Lymphocytes: 1.7 10*3/uL (ref 0.7–3.1)
BASOPHILS: 0 %
EOSINOPHILS: 0 %
HCT: 42.6 % (ref 37.5–51.0)
HGB: 14.1 g/dL (ref 12.6–17.7)
IMMATURE GRANULOCYTES: 0 %
Lymphocytes: 24 %
MCH: 29.9 pg (ref 26.6–33.0)
MCHC: 33.1 g/dL (ref 31.5–35.7)
MCV: 90 fL (ref 79–97)
MONOCYTES: 11 %
NEUTROPHILS: 65 %
PLATELET: 207 10*3/uL (ref 150–379)
RBC: 4.71 x10E6/uL (ref 4.14–5.80)
RDW: 13.8 % (ref 12.3–15.4)
WBC: 7 10*3/uL (ref 3.4–10.8)

## 2014-04-10 LAB — PSA, DIAGNOSTIC (PROSTATE SPECIFIC AG): Prostate Specific Ag: 2.6 ng/mL (ref 0.0–4.0)

## 2014-04-10 LAB — LIPID PANEL
Cholesterol, total: 194 mg/dL (ref 100–199)
HDL Cholesterol: 69 mg/dL (ref >39)
LDL, calculated: 111 mg/dL — ABNORMAL HIGH (ref 0–99)
Triglyceride: 70 mg/dL (ref 0–149)
VLDL, calculated: 14 mg/dL (ref 5–40)

## 2014-04-10 LAB — CVD REPORT

## 2014-04-10 LAB — TSH 3RD GENERATION: TSH: 2.03 u[IU]/mL (ref 0.450–4.500)

## 2014-04-12 NOTE — Progress Notes (Signed)
HISTORY OF PRESENT ILLNESS  Willie Stewart is a 58 y.o. male.  Chief Complaint   Patient presents with   ??? Follow-up     lov 08/10/13, home for 1 week from [place], review labs drawn 04/09/14     HPI  Here for yearly physical.  No chest pain.  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  He has eaten less but has exercised less    Past Medical History:  Past Medical History   Diagnosis Date   ??? Hypertension    ??? Insomnia    ??? Atopic dermatitis        Past Surgical History:  Past Surgical History   Procedure Laterality Date   ??? Hx hernia repair  1979   ??? Hx colonoscopy  2006     benign       Medications:  Prior to Admission medications    Medication Sig Start Date End Date Taking? Authorizing Provider   zolpidem (AMBIEN) 10 mg tablet Take 1 tablet by mouth nightly as needed for Sleep. 08/10/13  Yes Modena Nunnery., MD   losartan (COZAAR) 50 mg tablet Take 1 tablet by mouth daily. 08/10/13  Yes Modena Nunnery., MD   lovastatin (MEVACOR) 20 mg tablet Take 1 tablet by mouth nightly. 08/10/13  Yes Modena Nunnery., MD   mometasone (ELOCON) 0.1 % topical cream Apply  to affected area two (2) times a day. Indications: ATOPIC DERMATITIS 05/01/13  Yes Modena Nunnery., MD   b complex vitamins tablet Take 1 tablet by mouth daily.   Yes Historical Provider   ascorbic acid (VITAMIN C) 500 mg tablet Take 1,000 mg by mouth daily.   Yes Historical Provider   cholecalciferol, vitamin D3, (VITAMIN D3) 2,000 unit tab Take  by mouth.   Yes Historical Provider   Glucosam-Chon-Collag-Hyalur Ac 375-300-50-2 mg cap Take  by mouth daily.   Yes Historical Provider   ferrous sulfate (IRON) 325 mg (65 mg iron) tablet Take  by mouth Daily (before breakfast).   Yes Historical Provider   OTHER Apple Pectin takes 1 tablet once daily   Yes Historical Provider   coenzyme q10-vitamin e (COQ10 SG 100) 100-100 mg-unit cap Take  by mouth.   Yes Historical Provider       All:  No Known Allergies    Soc:   History     Social History   ??? Marital Status: MARRIED     Spouse Name: N/A     Number of Children: N/A   ??? Years of Education: N/A     Occupational History   ??? Not on file.     Social History Main Topics   ??? Smoking status: Never Smoker    ??? Smokeless tobacco: Never Used   ??? Alcohol Use: Yes      Comment: socially   ??? Drug Use: No   ??? Sexual Activity:     Partners: Female     Other Topics Concern   ??? Not on file     Social History Narrative       FHx:  Family History   Problem Relation Age of Onset   ??? Cancer Mother      breast and Leukemia   ??? Heart Disease Father      CAD   ??? Hypertension Father    ??? Cancer Father      Prostate  Review of Systems   Constitutional: Negative for fever and chills.   HENT: Positive for congestion (nasal).    Respiratory: Negative for shortness of breath.    Cardiovascular: Negative for chest pain.   Musculoskeletal: Positive for myalgias (minor). Negative for joint pain.   Neurological: Negative for headaches.     BP 134/88 mmHg   Pulse 72   Temp(Src) 97.8 ??F (36.6 ??C) (Oral)   Resp 14   Ht 5' 7.5" (1.715 m)   Wt 159 lb (72.122 kg)   BMI 24.52 kg/m2  Wt Readings from Last 3 Encounters:   04/12/14 159 lb (72.122 kg)   08/10/13 164 lb (74.39 kg)   05/01/13 163 lb (73.936 kg)     Physical Exam   Constitutional: He is oriented to person, place, and time. He appears well-developed and well-nourished. No distress.   HENT:   Head: Normocephalic and atraumatic.   Eyes: Conjunctivae are normal. No scleral icterus.   Neck: Neck supple. No JVD present.   Cardiovascular: Normal rate, regular rhythm, normal heart sounds and intact distal pulses.  Exam reveals no gallop and no friction rub.    No murmur heard.  Pulmonary/Chest: Effort normal and breath sounds normal. No respiratory distress. He has no wheezes.   Abdominal: Soft. Bowel sounds are normal. He exhibits no distension. There is no tenderness. There is no rebound and no guarding.    Musculoskeletal: Normal range of motion. He exhibits no edema or tenderness.   Neurological: He is alert and oriented to person, place, and time. No cranial nerve deficit.   Skin: Skin is warm and dry. He is not diaphoretic.   Psychiatric: He has a normal mood and affect. His behavior is normal. Thought content normal.   Nursing note and vitals reviewed.        Results for orders placed or performed in visit on 04/09/14   CBC WITH AUTOMATED DIFF   Result Value Ref Range    WBC 7.0 3.4 - 10.8 x10E3/uL    RBC 4.71 4.14 - 5.80 x10E6/uL    HGB 14.1 12.6 - 17.7 g/dL    HCT 42.6 37.5 - 51.0 %    MCV 90 79 - 97 fL    MCH 29.9 26.6 - 33.0 pg    MCHC 33.1 31.5 - 35.7 g/dL    RDW 13.8 12.3 - 15.4 %    PLATELET 207 150 - 379 x10E3/uL    NEUTROPHILS 65 %    Lymphocytes 24 %    MONOCYTES 11 %    EOSINOPHILS 0 %    BASOPHILS 0 %    ABS. NEUTROPHILS 4.6 1.4 - 7.0 x10E3/uL    Abs Lymphocytes 1.7 0.7 - 3.1 x10E3/uL    ABS. MONOCYTES 0.8 0.1 - 0.9 x10E3/uL    ABS. EOSINOPHILS 0.0 0.0 - 0.4 x10E3/uL    ABS. BASOPHILS 0.0 0.0 - 0.2 x10E3/uL    IMMATURE GRANULOCYTES 0 %    ABS. IMM. GRANS. 0.0 0.0 - 0.1 W73X1/GG   METABOLIC PANEL, COMPREHENSIVE   Result Value Ref Range    Glucose 101 (H) 65 - 99 mg/dL    BUN 17 6 - 24 mg/dL    Creatinine 1.13 0.76 - 1.27 mg/dL    GFR est non-AA 71 >59 mL/min/1.73    GFR est AA 82 >59 mL/min/1.73    BUN/Creatinine ratio 15 9 - 20    Sodium 142 134 - 144 mmol/L    Potassium 4.6 3.5 - 5.2 mmol/L  Chloride 102 97 - 108 mmol/L    CO2 25 18 - 29 mmol/L    Calcium 9.5 8.7 - 10.2 mg/dL    Protein, total 7.0 6.0 - 8.5 g/dL    Albumin 4.6 3.5 - 5.5 g/dL    GLOBULIN, TOTAL 2.4 1.5 - 4.5 g/dL    A-G Ratio 1.9 1.1 - 2.5    Bilirubin, total 0.9 0.0 - 1.2 mg/dL    Alk. phosphatase 46 39 - 117 IU/L    AST 18 0 - 40 IU/L    ALT 23 0 - 44 IU/L   PROSTATE SPECIFIC AG (PSA)   Result Value Ref Range    Prostate Specific Ag 2.6 0.0 - 4.0 ng/mL   LIPID PANEL   Result Value Ref Range     Cholesterol, total 194 100 - 199 mg/dL    Triglyceride 70 0 - 149 mg/dL    HDL Cholesterol 69 >39 mg/dL    VLDL, calculated 14 5 - 40 mg/dL    LDL, calculated 111 (H) 0 - 99 mg/dL   TSH, 3RD GENERATION   Result Value Ref Range    TSH 2.030 0.450 - 4.500 uIU/mL   CVD REPORT   Result Value Ref Range    INTERPRETATION Note        ASSESSMENT and PLAN    ICD-9-CM ICD-10-CM    1. Routine general medical examination at a health care facility V70.0 Z00.00    2. Essential hypertension 401.9 I10    3. Hyperlipemia 272.4 E78.5    4. Coronary artery disease involving native coronary artery without angina pectoris 414.01 I25.10    5. URI (upper respiratory infection) 465.9 J06.9      Encounter Diagnoses   Name Primary?   ??? Routine general medical examination at a health care facility Yes   ??? Essential hypertension    ??? Hyperlipemia    ??? Coronary artery disease involving native coronary artery without angina pectoris    ??? URI (upper respiratory infection)      No orders of the defined types were placed in this encounter.     Carthel was seen today for follow-up.    Diagnoses and associated orders for this visit:    Routine general medical examination at a health care facility    Essential hypertension    Hyperlipemia    Coronary artery disease involving native coronary artery without angina pectoris    URI (upper respiratory infection)      Follow-up Disposition:  Return in about 6 months (around 10/11/2014).     The current medical regimen is effective;  continue present plan and medications. Patient tolerating Mevacor and overall it is showing some effect.  It is a low potency medication but he can at least remain on this one.    [place].  His BP measurements at home etc were normal.  No changes in Cozaar.

## 2014-09-17 ENCOUNTER — Encounter: Admit: 2014-09-17 | Discharge: 2014-09-17 | Payer: PRIVATE HEALTH INSURANCE | Primary: Internal Medicine

## 2014-09-17 DIAGNOSIS — I10 Essential (primary) hypertension: Secondary | ICD-10-CM

## 2014-09-18 LAB — METABOLIC PANEL, COMPREHENSIVE
A-G Ratio: 1.9 (ref 1.1–2.5)
ALT (SGPT): 27 IU/L (ref 0–44)
AST (SGOT): 23 IU/L (ref 0–40)
Albumin: 4.3 g/dL (ref 3.5–5.5)
Alk. phosphatase: 47 IU/L (ref 39–117)
BUN/Creatinine ratio: 17 (ref 9–20)
BUN: 19 mg/dL (ref 6–24)
Bilirubin, total: 0.6 mg/dL (ref 0.0–1.2)
CO2: 23 mmol/L (ref 18–29)
Calcium: 9.2 mg/dL (ref 8.7–10.2)
Chloride: 101 mmol/L (ref 97–108)
Creatinine: 1.13 mg/dL (ref 0.76–1.27)
GFR est AA: 82 mL/min/{1.73_m2} (ref >59)
GFR est non-AA: 71 mL/min/{1.73_m2} (ref >59)
GLOBULIN, TOTAL: 2.3 g/dL (ref 1.5–4.5)
Glucose: 91 mg/dL (ref 65–99)
Potassium: 4.1 mmol/L (ref 3.5–5.2)
Protein, total: 6.6 g/dL (ref 6.0–8.5)
Sodium: 141 mmol/L (ref 134–144)

## 2014-09-18 LAB — LIPID PANEL
Cholesterol, total: 184 mg/dL (ref 100–199)
HDL Cholesterol: 70 mg/dL (ref >39)
LDL, calculated: 102 mg/dL — ABNORMAL HIGH (ref 0–99)
Triglyceride: 59 mg/dL (ref 0–149)
VLDL, calculated: 12 mg/dL (ref 5–40)

## 2014-09-18 LAB — CBC WITH AUTOMATED DIFF
ABS. BASOPHILS: 0 10*3/uL (ref 0.0–0.2)
ABS. EOSINOPHILS: 0.3 10*3/uL (ref 0.0–0.4)
ABS. IMM. GRANS.: 0 10*3/uL (ref 0.0–0.1)
ABS. MONOCYTES: 0.8 10*3/uL (ref 0.1–0.9)
ABS. NEUTROPHILS: 2.3 10*3/uL (ref 1.4–7.0)
Abs Lymphocytes: 1.5 10*3/uL (ref 0.7–3.1)
BASOPHILS: 0 %
EOSINOPHILS: 6 %
HCT: 40.3 % (ref 37.5–51.0)
HGB: 13.5 g/dL (ref 12.6–17.7)
IMMATURE GRANULOCYTES: 0 %
Lymphocytes: 30 %
MCH: 29.7 pg (ref 26.6–33.0)
MCHC: 33.5 g/dL (ref 31.5–35.7)
MCV: 89 fL (ref 79–97)
MONOCYTES: 17 %
NEUTROPHILS: 47 %
PLATELET: 153 10*3/uL (ref 150–379)
RBC: 4.55 x10E6/uL (ref 4.14–5.80)
RDW: 14 % (ref 12.3–15.4)
WBC: 5 10*3/uL (ref 3.4–10.8)

## 2014-09-18 LAB — CVD REPORT

## 2014-09-20 ENCOUNTER — Ambulatory Visit
Admit: 2014-09-20 | Discharge: 2014-09-20 | Payer: PRIVATE HEALTH INSURANCE | Attending: Internal Medicine | Primary: Internal Medicine

## 2014-09-20 DIAGNOSIS — I10 Essential (primary) hypertension: Secondary | ICD-10-CM

## 2014-09-20 MED ORDER — LOVASTATIN 20 MG TAB
20 mg | ORAL_TABLET | Freq: Every evening | ORAL | Status: DC
Start: 2014-09-20 — End: 2014-09-20

## 2014-09-20 MED ORDER — LOSARTAN 50 MG TAB
50 mg | ORAL_TABLET | Freq: Two times a day (BID) | ORAL | Status: DC
Start: 2014-09-20 — End: 2014-09-20

## 2014-09-20 MED ORDER — LOVASTATIN 20 MG TAB
20 mg | ORAL_TABLET | Freq: Every evening | ORAL | Status: DC
Start: 2014-09-20 — End: 2015-03-12

## 2014-09-20 MED ORDER — ZOLPIDEM 10 MG TAB
10 mg | ORAL_TABLET | Freq: Every evening | ORAL | Status: DC | PRN
Start: 2014-09-20 — End: 2015-08-12

## 2014-09-20 MED ORDER — LOSARTAN 50 MG TAB
50 mg | ORAL_TABLET | Freq: Two times a day (BID) | ORAL | Status: DC
Start: 2014-09-20 — End: 2015-02-04

## 2014-09-20 MED ORDER — AMOXICILLIN CLAVULANATE 875 MG-125 MG TAB
875-125 mg | ORAL_TABLET | Freq: Two times a day (BID) | ORAL | Status: DC
Start: 2014-09-20 — End: 2015-03-12

## 2014-09-20 NOTE — Progress Notes (Signed)
HISTORY OF PRESENT ILLNESS  Willie Stewart is a 59 y.o. male.  Chief Complaint   Patient presents with   ??? Follow Up Chronic Condition   ??? Medication Refill     Per Pt, need written Rx for 6 months supply and escribe 6 months supply to pharmacy.   ??? Cough     Per Pt, cough x 1 month.     HPI  Willie Stewart and works in [place].  No chest pain.    Took Cozaar 50 mg bid with improved control of BP.    Tolerating Mevacor with positive results.    Persistent cough.  Treated multiple times in [place].    Review of Systems   Respiratory: Positive for cough. Negative for sputum production and shortness of breath.    Cardiovascular: Negative for chest pain and palpitations.   Musculoskeletal: Positive for myalgias and joint pain.     BP 122/80 mmHg   Pulse 71   Temp(Src) 97.9 ??F (36.6 ??C) (Oral)   Resp 12   Ht 5' 7.5" (1.715 m)   Wt 157 lb 9.6 oz (71.487 kg)   BMI 24.31 kg/m2   SpO2 99%  Wt Readings from Last 3 Encounters:   09/20/14 157 lb 9.6 oz (71.487 kg)   04/12/14 159 lb (72.122 kg)   08/10/13 164 lb (74.39 kg)     Physical Exam   Constitutional: He is oriented to person, place, and time. He appears well-developed and well-nourished. No distress.   HENT:   Head: Normocephalic and atraumatic.   Mouth/Throat: No oropharyngeal exudate.   Eyes: Conjunctivae are normal. Pupils are equal, round, and reactive to light. No scleral icterus.   Neck: Normal range of motion. Neck supple. No JVD present. No tracheal deviation present. No thyromegaly present.   Cardiovascular: Normal rate, regular rhythm, normal heart sounds and intact distal pulses.  Exam reveals no gallop and no friction rub.    No murmur heard.  Pulmonary/Chest: Effort normal and breath sounds normal. No respiratory distress. He has no wheezes.   Abdominal: Soft. He exhibits no distension. There is no tenderness.   Musculoskeletal: Normal range of motion. He exhibits no edema or tenderness.   Lymphadenopathy:     He has no cervical adenopathy.    Neurological: He is alert and oriented to person, place, and time. No cranial nerve deficit.   Skin: Skin is warm and dry. He is not diaphoretic.   Psychiatric: He has a normal mood and affect. His behavior is normal. Thought content normal.   Nursing note and vitals reviewed.      Results for orders placed or performed in visit on 09/17/14   CBC WITH AUTOMATED DIFF   Result Value Ref Range    WBC 5.0 3.4 - 10.8 x10E3/uL    RBC 4.55 4.14 - 5.80 x10E6/uL    HGB 13.5 12.6 - 17.7 g/dL    HCT 40.3 37.5 - 51.0 %    MCV 89 79 - 97 fL    MCH 29.7 26.6 - 33.0 pg    MCHC 33.5 31.5 - 35.7 g/dL    RDW 14.0 12.3 - 15.4 %    PLATELET 153 150 - 379 x10E3/uL    NEUTROPHILS 47 %    Lymphocytes 30 %    MONOCYTES 17 %    EOSINOPHILS 6 %    BASOPHILS 0 %    ABS. NEUTROPHILS 2.3 1.4 - 7.0 x10E3/uL    Abs Lymphocytes 1.5 0.7 - 3.1 x10E3/uL  ABS. MONOCYTES 0.8 0.1 - 0.9 x10E3/uL    ABS. EOSINOPHILS 0.3 0.0 - 0.4 x10E3/uL    ABS. BASOPHILS 0.0 0.0 - 0.2 x10E3/uL    IMMATURE GRANULOCYTES 0 %    ABS. IMM. GRANS. 0.0 0.0 - 0.1 x10E3/uL   METABOLIC PANEL, COMPREHENSIVE   Result Value Ref Range    Glucose 91 65 - 99 mg/dL    BUN 19 6 - 24 mg/dL    Creatinine 4.40 3.47 - 1.27 mg/dL    GFR est non-AA 71 >42 mL/min/1.73    GFR est AA 82 >59 mL/min/1.73    BUN/Creatinine ratio 17 9 - 20    Sodium 141 134 - 144 mmol/L    Potassium 4.1 3.5 - 5.2 mmol/L    Chloride 101 97 - 108 mmol/L    CO2 23 18 - 29 mmol/L    Calcium 9.2 8.7 - 10.2 mg/dL    Protein, total 6.6 6.0 - 8.5 g/dL    Albumin 4.3 3.5 - 5.5 g/dL    GLOBULIN, TOTAL 2.3 1.5 - 4.5 g/dL    A-G Ratio 1.9 1.1 - 2.5    Bilirubin, total 0.6 0.0 - 1.2 mg/dL    Alk. phosphatase 47 39 - 117 IU/L    AST 23 0 - 40 IU/L    ALT 27 0 - 44 IU/L   LIPID PANEL   Result Value Ref Range    Cholesterol, total 184 100 - 199 mg/dL    Triglyceride 59 0 - 149 mg/dL    HDL Cholesterol 70 >59 mg/dL    VLDL, calculated 12 5 - 40 mg/dL    LDL, calculated 563 (H) 0 - 99 mg/dL   CVD REPORT   Result Value Ref Range     INTERPRETATION Note        ASSESSMENT and PLAN    ICD-10-CM ICD-9-CM    1. Essential hypertension I10 401.9 losartan (COZAAR) 50 mg tablet      CBC WITH AUTOMATED DIFF      METABOLIC PANEL, COMPREHENSIVE      LIPID PANEL      TSH, 3RD GENERATION      UA/M W/RFLX CULTURE, ROUTINE      DISCONTINUED: losartan (COZAAR) 50 mg tablet   2. Hyperlipidemia, unspecified hyperlipidemia type E78.5 272.4 lovastatin (MEVACOR) 20 mg tablet      METABOLIC PANEL, COMPREHENSIVE      LIPID PANEL      DISCONTINUED: lovastatin (MEVACOR) 20 mg tablet   3. Sleep disorder, circadian, shift work type G47.26 327.36 zolpidem (AMBIEN) 10 mg tablet   4. Anemia, unspecified type D64.9 285.9 CBC WITH AUTOMATED DIFF      IRON      FERRITIN   5. Coronary artery disease involving native coronary artery of native heart without angina pectoris I25.10 414.01 METABOLIC PANEL, COMPREHENSIVE      LIPID PANEL   6. Hemangioma of liver D18.03 228.04    7. Bronchitis J40 490 amoxicillin-clavulanate (AUGMENTIN) 875-125 mg per tablet   8. Prostate cancer screening Z12.5 V76.44 PROSTATE SPECIFIC AG (PSA)     Encounter Diagnoses   Name Primary?   ??? Essential hypertension Yes   ??? Hyperlipidemia, unspecified hyperlipidemia type    ??? Sleep disorder, circadian, shift work type    ??? Anemia, unspecified type    ??? Coronary artery disease involving native coronary artery of native heart without angina pectoris    ??? Hemangioma of liver    ??? Bronchitis    ??? Prostate  cancer screening      Orders Placed This Encounter   ??? CBC WITH AUTOMATED DIFF   ??? METABOLIC PANEL, COMPREHENSIVE   ??? LIPID PANEL   ??? PROSTATE SPECIFIC AG   ??? TSH, 3RD GENERATION   ??? UA/M W/RFLX CULTURE, ROUTINE   ??? Iron   ??? Ferritin   ??? DISCONTD: losartan (COZAAR) 50 mg tablet   ??? DISCONTD: lovastatin (MEVACOR) 20 mg tablet   ??? zolpidem (AMBIEN) 10 mg tablet   ??? losartan (COZAAR) 50 mg tablet   ??? lovastatin (MEVACOR) 20 mg tablet   ??? amoxicillin-clavulanate (AUGMENTIN) 875-125 mg per tablet      Willie Stewart was seen today for follow up chronic condition, medication refill and cough.    Diagnoses and all orders for this visit:    Essential hypertension  Orders:  -     Discontinue: losartan (COZAAR) 50 mg tablet; Take 1 Tab by mouth two (2) times a day.  -     losartan (COZAAR) 50 mg tablet; Take 1 Tab by mouth two (2) times a day.  -     CBC WITH AUTOMATED DIFF; Future  -     METABOLIC PANEL, COMPREHENSIVE; Future  -     LIPID PANEL; Future  -     TSH, 3RD GENERATION; Future  -     UA/M W/RFLX CULTURE, ROUTINE; Future    Hyperlipidemia, unspecified hyperlipidemia type  Orders:  -     Discontinue: lovastatin (MEVACOR) 20 mg tablet; Take 1 Tab by mouth nightly.  -     lovastatin (MEVACOR) 20 mg tablet; Take 1 Tab by mouth nightly.  -     METABOLIC PANEL, COMPREHENSIVE; Future  -     LIPID PANEL; Future    Sleep disorder, circadian, shift work type  Orders:  -     zolpidem (AMBIEN) 10 mg tablet; Take 1 Tab by mouth nightly as needed for Sleep.    Anemia, unspecified type  Orders:  -     CBC WITH AUTOMATED DIFF; Future  -     Iron; Future  -     Ferritin; Future    Coronary artery disease involving native coronary artery of native heart without angina pectoris  Orders:  -     METABOLIC PANEL, COMPREHENSIVE; Future  -     LIPID PANEL; Future    Hemangioma of liver    Bronchitis  Orders:  -     amoxicillin-clavulanate (AUGMENTIN) 875-125 mg per tablet; Take 1 Tab by mouth every twelve (12) hours.    Prostate cancer screening  Orders:  -     PROSTATE SPECIFIC AG; Future      Follow-up Disposition:  Return in about 6 months (around 03/21/2015).     Trying to get 6 months of meds.

## 2015-02-04 ENCOUNTER — Encounter

## 2015-02-04 MED ORDER — LOSARTAN 50 MG TAB
50 mg | ORAL_TABLET | Freq: Two times a day (BID) | ORAL | Status: DC
Start: 2015-02-04 — End: 2015-03-12

## 2015-02-04 NOTE — Telephone Encounter (Signed)
[facility] is [facility]

## 2015-02-04 NOTE — Telephone Encounter (Signed)
-----   Message from [facility] Mychart sent at 02/04/2015  3:11 PM EDT -----  Regarding: Non-Urgent Medical Question  Contact: [phone]  I switched my prescriptions from [facility] to [facility] mail order.  I am told they have the prescription for blood pressure meds on hold pending your office approval.   Please go ahead and approve.  I will not be obtaining the balance of the existing prescription from [facility] ([facility]).   Give me a call if you have any questions.   Thanks

## 2015-02-06 NOTE — Telephone Encounter (Signed)
Notified patient's wife that prescriptions were sent to pharmacy

## 2015-03-06 ENCOUNTER — Encounter: Admit: 2015-03-06 | Discharge: 2015-03-06 | Payer: PRIVATE HEALTH INSURANCE | Primary: Internal Medicine

## 2015-03-06 DIAGNOSIS — I10 Essential (primary) hypertension: Secondary | ICD-10-CM

## 2015-03-07 LAB — UA/M W/RFLX CULTURE, ROUTINE
Bilirubin: NEGATIVE
Blood: NEGATIVE
Glucose: NEGATIVE
Ketone: NEGATIVE
Leukocyte Esterase: NEGATIVE
Nitrites: NEGATIVE
Protein: NEGATIVE
Specific Gravity: 1.007 (ref 1.005–1.030)
Urobilinogen: 0.2 mg/dL (ref 0.2–1.0)
pH (UA): 6.5 (ref 5.0–7.5)

## 2015-03-07 LAB — METABOLIC PANEL, COMPREHENSIVE
A-G Ratio: 1.8 (ref 1.1–2.5)
ALT (SGPT): 22 IU/L (ref 0–44)
AST (SGOT): 17 IU/L (ref 0–40)
Albumin: 4.5 g/dL (ref 3.6–4.8)
Alk. phosphatase: 41 IU/L (ref 39–117)
BUN/Creatinine ratio: 13 (ref 10–22)
BUN: 16 mg/dL (ref 8–27)
Bilirubin, total: 1 mg/dL (ref 0.0–1.2)
CO2: 28 mmol/L (ref 18–29)
Calcium: 9.6 mg/dL (ref 8.6–10.2)
Chloride: 100 mmol/L (ref 97–108)
Creatinine: 1.22 mg/dL (ref 0.76–1.27)
GFR est AA: 74 mL/min/{1.73_m2} (ref >59)
GFR est non-AA: 64 mL/min/{1.73_m2} (ref >59)
GLOBULIN, TOTAL: 2.5 g/dL (ref 1.5–4.5)
Glucose: 103 mg/dL — ABNORMAL HIGH (ref 65–99)
Potassium: 5 mmol/L (ref 3.5–5.2)
Protein, total: 7 g/dL (ref 6.0–8.5)
Sodium: 140 mmol/L (ref 134–144)

## 2015-03-07 LAB — FERRITIN: Ferritin: 552 ng/mL — ABNORMAL HIGH (ref 30–400)

## 2015-03-07 LAB — CBC WITH AUTOMATED DIFF
ABS. BASOPHILS: 0 10*3/uL (ref 0.0–0.2)
ABS. EOSINOPHILS: 0 10*3/uL (ref 0.0–0.4)
ABS. IMM. GRANS.: 0 10*3/uL (ref 0.0–0.1)
ABS. MONOCYTES: 0.4 10*3/uL (ref 0.1–0.9)
ABS. NEUTROPHILS: 2.8 10*3/uL (ref 1.4–7.0)
Abs Lymphocytes: 1.4 10*3/uL (ref 0.7–3.1)
BASOPHILS: 0 %
EOSINOPHILS: 0 %
HCT: 41.4 % (ref 37.5–51.0)
HGB: 13.9 g/dL (ref 12.6–17.7)
IMMATURE GRANULOCYTES: 0 %
Lymphocytes: 30 %
MCH: 30.3 pg (ref 26.6–33.0)
MCHC: 33.6 g/dL (ref 31.5–35.7)
MCV: 90 fL (ref 79–97)
MONOCYTES: 9 %
NEUTROPHILS: 61 %
PLATELET: 177 10*3/uL (ref 150–379)
RBC: 4.59 x10E6/uL (ref 4.14–5.80)
RDW: 13.5 % (ref 12.3–15.4)
WBC: 4.6 10*3/uL (ref 3.4–10.8)

## 2015-03-07 LAB — MICROSCOPIC EXAMINATION
Bacteria: NONE SEEN
Casts: NONE SEEN /lpf
Epithelial cells: NONE SEEN /hpf (ref 0–10)
RBC: NONE SEEN /hpf (ref 0–?)

## 2015-03-07 LAB — CVD REPORT

## 2015-03-07 LAB — LIPID PANEL
Cholesterol, total: 205 mg/dL — ABNORMAL HIGH (ref 100–199)
HDL Cholesterol: 75 mg/dL (ref >39)
LDL, calculated: 114 mg/dL — ABNORMAL HIGH (ref 0–99)
Triglyceride: 80 mg/dL (ref 0–149)
VLDL, calculated: 16 mg/dL (ref 5–40)

## 2015-03-07 LAB — IRON: Iron: 105 ug/dL (ref 38–169)

## 2015-03-07 LAB — TSH 3RD GENERATION: TSH: 1.96 u[IU]/mL (ref 0.450–4.500)

## 2015-03-07 LAB — PSA, DIAGNOSTIC (PROSTATE SPECIFIC AG): Prostate Specific Ag: 2.9 ng/mL (ref 0.0–4.0)

## 2015-03-12 ENCOUNTER — Ambulatory Visit
Admit: 2015-03-12 | Discharge: 2015-03-12 | Payer: PRIVATE HEALTH INSURANCE | Attending: Family | Primary: Internal Medicine

## 2015-03-12 DIAGNOSIS — S46211D Strain of muscle, fascia and tendon of other parts of biceps, right arm, subsequent encounter: Secondary | ICD-10-CM

## 2015-03-12 MED ORDER — LOVASTATIN 20 MG TAB
20 mg | ORAL_TABLET | Freq: Every evening | ORAL | 1 refills | Status: DC
Start: 2015-03-12 — End: 2015-08-12

## 2015-03-12 MED ORDER — TRAZODONE 50 MG TAB
50 mg | ORAL_TABLET | Freq: Every evening | ORAL | 0 refills | Status: DC
Start: 2015-03-12 — End: 2015-08-12

## 2015-03-12 MED ORDER — LOSARTAN 50 MG TAB
50 mg | ORAL_TABLET | Freq: Two times a day (BID) | ORAL | 1 refills | Status: DC
Start: 2015-03-12 — End: 2015-08-12

## 2015-03-12 NOTE — Patient Instructions (Addendum)
Low Back Pain: Exercises  Your Care Instructions  Here are some examples of typical rehabilitation exercises for your condition. Start each exercise slowly. Ease off the exercise if you start to have pain.  Your doctor or physical therapist will tell you when you can start these exercises and which ones will work best for you.  How to do the exercises  Press-up    1. Lie on your stomach, supporting your body with your forearms.  2. Press your elbows down into the floor to raise your upper back. As you do this, relax your stomach muscles and allow your back to arch without using your back muscles. As your press up, do not let your hips or pelvis come off the floor.  3. Hold for 15 to 30 seconds, then relax.  4. Repeat 2 to 4 times.  Alternate arm and leg (bird dog) exercise    Note: Do this exercise slowly. Try to keep your body straight at all times, and do not let one hip drop lower than the other.  1. Start on the floor, on your hands and knees.  2. Tighten your belly muscles.  3. Raise one leg off the floor, and hold it straight out behind you. Be careful not to let your hip drop down, because that will twist your trunk.  4. Hold for about 6 seconds, then lower your leg and switch to the other leg.  5. Repeat 8 to 12 times on each leg.  6. Over time, work up to holding for 10 to 30 seconds each time.  7. If you feel stable and secure with your leg raised, try raising the opposite arm straight out in front of you at the same time.  Knee-to-chest exercise    1. Lie on your back with your knees bent and your feet flat on the floor.  2. Bring one knee to your chest, keeping the other foot flat on the floor (or keeping the other leg straight, whichever feels better on your lower back).  3. Keep your lower back pressed to the floor. Hold for at least 15 to 30 seconds.  4. Relax, and lower the knee to the starting position.  5. Repeat with the other leg. Repeat 2 to 4 times with each leg.   6. To get more stretch, put your other leg flat on the floor while pulling your knee to your chest.  Curl-ups    1. Lie on the floor on your back with your knees bent at a 90-degree angle. Your feet should be flat on the floor, about 12 inches from your buttocks.  2. Cross your arms over your chest. If this bothers your neck, try putting your hands behind your neck (not your head), with your elbows spread apart.  3. Slowly tighten your belly muscles and raise your shoulder blades off the floor.  4. Keep your head in line with your body, and do not press your chin to your chest.  5. Hold this position for 1 or 2 seconds, then slowly lower yourself back down to the floor.  6. Repeat 8 to 12 times.  Pelvic tilt exercise    1. Lie on your back with your knees bent.  2. "Brace" your stomach. This means to tighten your muscles by pulling in and imagining your belly button moving toward your spine. You should feel like your back is pressing to the floor and your hips and pelvis are rocking back.  3. Hold for about 6   seconds while you breathe smoothly.  4. Repeat 8 to 12 times.  Heel dig bridging    1. Lie on your back with both knees bent and your ankles bent so that only your heels are digging into the floor. Your knees should be bent about 90 degrees.  2. Then push your heels into the floor, squeeze your buttocks, and lift your hips off the floor until your shoulders, hips, and knees are all in a straight line.  3. Hold for about 6 seconds as you continue to breathe normally, and then slowly lower your hips back down to the floor and rest for up to 10 seconds.  4. Do 8 to 12 repetitions.  Hamstring stretch in doorway    1. Lie on your back in a doorway, with one leg through the open door.  2. Slide your leg up the wall to straighten your knee. You should feel a gentle stretch down the back of your leg.  3. Hold the stretch for at least 15 to 30 seconds. Do not arch your back,  point your toes, or bend either knee. Keep one heel touching the floor and the other heel touching the wall.  4. Repeat with your other leg.  5. Do 2 to 4 times for each leg.  Hip flexor stretch    1. Kneel on the floor with one knee bent and one leg behind you. Place your forward knee over your foot. Keep your other knee touching the floor.  2. Slowly push your hips forward until you feel a stretch in the upper thigh of your rear leg.  3. Hold the stretch for at least 15 to 30 seconds. Repeat with your other leg.  4. Do 2 to 4 times on each side.  Wall sit    1. Stand with your back 10 to 12 inches away from a wall.  2. Lean into the wall until your back is flat against it.  3. Slowly slide down until your knees are slightly bent, pressing your lower back into the wall.  4. Hold for about 6 seconds, then slide back up the wall.  5. Repeat 8 to 12 times.  Follow-up care is a key part of your treatment and safety. Be sure to make and go to all appointments, and call your doctor if you are having problems. It's also a good idea to know your test results and keep a list of the medicines you take.  Where can you learn more?  Go to http://www.healthwise.net/[facility]  Enter Z938 in the search box to learn more about "Low Back Pain: Exercises."  ?? 2006-2016 [employer]. Care instructions adapted under license by [employer] (which disclaims liability or warranty for this information). This care instruction is for use with your licensed healthcare professional. If you have questions about a medical condition or this instruction, always ask your healthcare professional. Healthwise, Incorporated disclaims any warranty or liability for your use of this information.  Content Version: 10.9.538570; Current as of: Dec 15, 2013

## 2015-03-12 NOTE — Progress Notes (Signed)
HISTORY OF PRESENT ILLNESS  Willie Stewart is a 59 y.o. male.  HPI Comments: The patient is a 59 y.o. male who is seen for follow up of Hyperlipidemia, Anemia, CAD.  Symptoms are stable and well controlled.  The patient is following treatment regimen with good compliance.  He  is following an appropriate diet and is not exercising regularly. Patient denies symptoms of chest pain, weakness, fatigue, and shortness of breath.     Patients [place] and he has a history of a ruptured bicep tendon. Patient denies pain but would like to see orthopedics for problem.     Patient states he also has chronic low back pain and would like to try physical therapy for problem. Patient states his chronic low back pain is increases while he is sitting at a desk. Patient states he takes OTC medications for pain relief.     Medication Evaluation   Pertinent negatives include no chest pain, no abdominal pain, no headaches and no shortness of breath.       Review of Systems   Constitutional: Negative for malaise/fatigue.   Respiratory: Negative for cough and shortness of breath.    Cardiovascular: Negative for chest pain and leg swelling.   Gastrointestinal: Negative for abdominal pain.   Genitourinary: Negative for dysuria, frequency and urgency.   Musculoskeletal: Positive for back pain (low back pain). Negative for myalgias.   Skin: Negative for itching and rash.   Neurological: Negative for dizziness, tingling and headaches.   Psychiatric/Behavioral: Negative for depression and suicidal ideas.       Physical Exam   Constitutional: He is oriented to person, place, and time. Vital signs are normal. He appears well-developed and well-nourished. He is cooperative. No distress.   HENT:   Head: Normocephalic.   Eyes: Conjunctivae are normal.   Neck: Trachea normal and normal range of motion. Neck supple. Carotid bruit is not present. No thyromegaly present.    Cardiovascular: Normal rate, regular rhythm and normal heart sounds.    No murmur heard.  Pulmonary/Chest: Effort normal and breath sounds normal. No respiratory distress.   Abdominal: Soft. Bowel sounds are normal. He exhibits no distension. There is no tenderness.   Musculoskeletal: Normal range of motion. He exhibits deformity (right upper arm from bicep tendon tear).        Lumbar back: He exhibits pain. He exhibits no tenderness.        Right upper arm: He exhibits deformity. He exhibits no tenderness.   Neurological: He is alert and oriented to person, place, and time.   Skin: Skin is warm and dry. No rash noted. He is not diaphoretic.   Psychiatric: He has a normal mood and affect. His behavior is normal. Judgment and thought content normal.     Visit Vitals   ??? BP 124/82 (BP 1 Location: Left arm, BP Patient Position: Sitting)   ??? Pulse 64   ??? Temp 98.4 ??F (36.9 ??C) (Oral)   ??? Ht 5' 7.5" (1.715 m)   ??? Wt 163 lb (73.9 kg)   ??? BMI 25.15 kg/m2     Past Medical History   Diagnosis Date   ??? Atopic dermatitis    ??? Hypertension    ??? Insomnia      Current Outpatient Prescriptions on File Prior to Visit   Medication Sig Dispense Refill   ??? zolpidem (AMBIEN) 10 mg tablet Take 1 Tab by mouth nightly as needed for Sleep. 90 Tab  3   ??? mometasone (ELOCON) 0.1 % topical cream Apply  to affected area two (2) times a day. Indications: ATOPIC DERMATITIS 15 g 3   ??? b complex vitamins tablet Take 1 tablet by mouth daily.     ??? ascorbic acid (VITAMIN C) 500 mg tablet Take 1,000 mg by mouth daily.     ??? cholecalciferol, vitamin D3, (VITAMIN D3) 2,000 unit tab Take  by mouth.     ??? Glucosam-Chon-Collag-Hyalur Ac 375-300-50-2 mg cap Take  by mouth daily.     ??? ferrous sulfate (IRON) 325 mg (65 mg iron) tablet Take  by mouth Daily (before breakfast).     ??? OTHER Apple Pectin takes 1 tablet once daily     ??? coenzyme q10-vitamin e (COQ10 SG 100) 100-100 mg-unit cap Take  by mouth.        No current facility-administered medications on file prior to visit.        ASSESSMENT and PLAN    ICD-10-CM ICD-9-CM    1. Essential hypertension, hypertension with unspecified goal I10 401.9 losartan (COZAAR) 50 mg tablet   2. Hyperlipidemia, unspecified hyperlipidemia type E78.5 272.4 lovastatin (MEVACOR) 20 mg tablet     -Refills given for Cozaar and lovastatin.  -Referral for orthopedics and physical therapy made.   -Patient given instructions on back stretches that he can try at home.   -Patient states he makes his appointments when he is in country and will probably follow up prior to leave the country at the end of the year.     Follow-up Disposition:  Return in about 6 months (around 09/12/2015), or if symptoms worsen or fail to improve.

## 2015-03-13 ENCOUNTER — Encounter: Primary: Internal Medicine

## 2015-03-15 ENCOUNTER — Inpatient Hospital Stay: Admit: 2015-03-15 | Payer: PRIVATE HEALTH INSURANCE | Primary: Internal Medicine

## 2015-03-15 DIAGNOSIS — M545 Low back pain, unspecified: Secondary | ICD-10-CM

## 2015-03-15 NOTE — Progress Notes (Signed)
Ambulatory/Rehab Services H2 Model Falls Risk Assessment    Risk Factor Pts. ??   Confusion/Disorientation/Impulsivity      4 ??   Symptomatic Depression     2 ??   Altered Elimination     1 ??   Dizziness/Vertigo     1 ??   Gender (Male)     1 ??   Any administered antiepileptics (anticonvulsants):     2 ??   Any administered benzodiazepines:     1 ??   Visual Impairment (specify):     1 ??   Portable Oxygen Use     1 ??   Orthostatic ? BP     1 ??   History of Recent Falls (within 3 mos.)     5     Ability to Rise from Chair (choose one) Pts. ??   Ability to rise in a single movement     0 ??   Pushes up, successful in one attempt     1 ??   Multiple attempts, but successful     3 ??   Unable to rise without assistance     4   Total: (5 or greater = High Risk) 1     Falls Prevention Plan:                   Physical Limitations to Exercise (specify):                   Mobility Assistance Device (type):                   Exercise/Equipment Adaptation (specify):    ??2010 [employer] All Rights Reserved. [place] Patent #7,[id number]. Federal Law prohibits the replication, distribution or use without written permission from [employer]

## 2015-03-15 NOTE — Progress Notes (Signed)
Therapy Center at [facility]    [address] [place] [address]  Phone:[phone]    [phone]  Outpatient PHYSICAL THERAPY: Initial Assessment 03/15/2015   Fall Risk Score: 1 (? 5 = High Risk)    ICD-10: Treatment Diagnosis: Low back pain (M54.5)  REFERRING PHYSICIAN: Inis Sizer, NP  MD Orders: Evaluate and Treat  Return Physician Appointment: -  MEDICAL/REFERRING DIAGNOSIS: chronic bilateral low back pain without sciatica  DATE OF ONSET: 1 year ago  PRIOR LEVEL OF FUNCTION: independent and active  PRECAUTIONS/ALLERGIES: No Known Allergies  ASSESSMENT:  ????????This section established at most recent assessment??????????  Patient presents to physical therapy with a 1 year history of central low back pain. Physical therapy evaluation demonstrates centralized and reduced pain with postural correction, and decreased pain with extension based exercises indicating a posterior derangement with underling postural syndrome.   PROBLEM LIST (Impairments causing functional limitations):  1. Decreased Strength affecting function  2. Decreased ADL/Functional Activities  3. Decreased Flexibility/joint mobility  4. Decreased transfer abilities  5. Increased Pain affecting function  6. Decreased Activity tolerance   GOALS: (Goals have been discussed and agreed upon with patient.)  Short Term Goals  1. Pt will be independent with comprehensive HEP to centralize and reduce low back pain  2. Pt will participate in LE stretching program to increase flexibility  3. Pt will participate in core stabilization exercises to help with stabilization during ADLs  4. Pt will participate in LE strengthening program with weights as appropriate to help with gait and elevations  5. Pt will participate in static and dynamic balance activities to decrease the risk for falls  6. Pt will tolerate manual therapy/joint mobilizations/soft tissue to increase ROM and decrease pain   7. Pt will report <5/10 pain to demonstrate improvement in function  8. Pt will demonstrate a 5 point decreased on the Modified Oswestry Low Back Disability Questionnaire to show improvement in function  Long Term Goals  1. Pt will demonstrate a 10 point decrease in the Modified Oswestry Low Back Disability Questionnaire to show improvement in function  2. Pt will report 0/10 pain at rest and during ADLs  Pt will be able to flex and extend lumbar spine without peripheralization of symptoms to demonstrate reduced derangement   REHABILITATION POTENTIAL FOR STATED GOALS: GoodPLAN OF CARE:  INTERVENTIONS PLANNED: (Benefits and precautions of physical therapy have been discussed with the patient.)  1. balance exercise  2. cold  3. electrical stimulation  4. family education  5. heat  6. home exercise program (HEP)  7. manual therapy  8. range of motion: active/assisted/passive  9. therapeutic exercise/strengthening  TREATMENT PLAN EFFECTIVE DATES: 03-15-15 TO 06-15-15  FREQUENCY/DURATION: Follow patient 2 times a week for 4-6 weeks to address above goals.  Regarding [employer] therapy, I certify that the treatment plan above will be carried out by a therapist or under their direction.  Thank you for this referral,  Algernon Huxley. Simona Huh, DPT     Referring Physician Signature: Inis Sizer, NP          Date                       SUBJECTIVE:    History of Present Injury/Illness (Reason for Referral): Patient reports having back pain for over 1 year. 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 Pain is increased with prolonged sitting. Has to get up to ease the pain. Pain  is better with movement. Pain is central low back with no radiation to hips. Pain is intermittent. Lying down and bending does not change symptoms.Patient reports being able to lift weights and ride the eliptical and pain is not aggravated. Has had  no imaging. Denies numbness/tingling. Takes advil for pain and it seems to help.   Present Symptoms: Pain 5/10 (current- dull achy)      Dominant Side: right  Past Medical History:   Past Medical History   Diagnosis Date   ??? Atopic dermatitis    ??? Hypertension    ??? Insomnia      Current Medications:   Current Outpatient Prescriptions on File Prior to Encounter   Medication Sig Dispense Refill   ??? losartan (COZAAR) 50 mg tablet Take 1 Tab by mouth two (2) times a day. 360 Tab 1   ??? lovastatin (MEVACOR) 20 mg tablet Take 1 Tab by mouth nightly. 180 Tab 1   ??? traZODone (DESYREL) 50 mg tablet Take 1 Tab by mouth nightly. 90 Tab 0   ??? zolpidem (AMBIEN) 10 mg tablet Take 1 Tab by mouth nightly as needed for Sleep. 90 Tab 3   ??? mometasone (ELOCON) 0.1 % topical cream Apply  to affected area two (2) times a day. Indications: ATOPIC DERMATITIS 15 g 3   ??? b complex vitamins tablet Take 1 tablet by mouth daily.     ??? ascorbic acid (VITAMIN C) 500 mg tablet Take 1,000 mg by mouth daily.     ??? cholecalciferol, vitamin D3, (VITAMIN D3) 2,000 unit tab Take  by mouth.     ??? Glucosam-Chon-Collag-Hyalur Ac 375-300-50-2 mg cap Take  by mouth daily.     ??? ferrous sulfate (IRON) 325 mg (65 mg iron) tablet Take  by mouth Daily (before breakfast).     ??? OTHER Apple Pectin takes 1 tablet once daily     ??? coenzyme q10-vitamin e (COQ10 SG 100) 100-100 mg-unit cap Take  by mouth.       No current facility-administered medications on file prior to encounter.       Date Last Reviewed: 03/15/2015   Social History/Home Situation: live with family, no barriers     Work/Activity History: full time, Government social research officer  OBJECTIVE:    Outcome Measure:   Tool Used: Modified Oswestry Low Back Pain Questionnaire  Score:  Initial: 14/50  Most Recent: X/50 (Date: -- )       Observation/Orthostatic Postural Assessment:    In sitting patient with slouched posture. Postural correction reduces and centralizes pain. In  standing patient with reduced lumbar lordosis. All pelvic landmarks level  Palpation:    extension mobilization- increased, NW  ROM:   Lumbar Movement Loss:  Flexion- min  Extension- nil  Side Glide Right- nil  Side Glide Left- nil                       Strength:  Right LE:  Hip Flexion 5/5  Hip Extension 5/5  Hip Abduction 5/5  Knee Flexion 5/5  Knee Extension 5/5  Ankle Dorsiflexion 5/5  Ankle Plantarflexion 5/5 Left LE:  Hip Flexion 5/5  Hip Extension 5/5  Hip Abduction 5/5  Knee Flexion 5/5  Knee Extension 5/5  Ankle Dorsiflexion 5/5  Ankle Plantarflexion 5/5      Special Tests:     B=Better NB= No Better S=Same W=Worse NW= No Worse  A= Abolish P=Produces (+) =increasses  (-) = decreases  Activity  reps  during  after  Activity recheck     Activity recheck        Flexion in Standing 1 increased Worse     Extension in Standing 1 increased NW     Prone Press Ups 10 decreased better     Prone Press Ups with OP 10 increased NW                         Neurological Screen:   Myotomes: intact  Dermatomes: intact  Reflexes: not tested  ?? 0- No response  ?? 1+ Diminished  ?? 2+ Average  ?? 3+ Brisk  ?? 4+ Very brisk    TREATMENT:    (In addition to Assessment/Re-Assessment sessions the following treatments were rendered)  THERAPEUTIC EXERCISE: ( minutes):  Exercises per grid below to improve mobility, strength and balance.  Required minimal visual, verbal and manual cues to promote proper body alignment and promote proper body posture.  Progressed resistance, range and repetitions as indicated.   Date:  03-15-15 Date:   Date:     Activity/Exercise Parameters Parameters Parameters   Patient Education Discussed HEP (prone press ups 10 times every couple hours, machines to avoid at gym, lumbar support and posture) and POC                                           Manual Therapy (     ):   Therapeutic Modalities:                                                                                                HEP: As above; handouts given to patient for all exercises.  ______________________________________________________________________________________________________    Treatment Assessment:  See assessment above.  Next Scheduled Visit (s):  Future Appointments  Date Time Provider [place]   03/25/2015 8:15 AM Coral Ceo, DPT [facility]   03/28/2015 7:30 AM Coral Ceo, DPT [facility]   03/28/2015 5:15 PM Coral Ceo, DPT [facility]   04/08/2015 7:30 AM Coral Ceo, DPT [facility]   04/11/2015 5:15 PM Coral Ceo, DPT [facility]   04/15/2015 7:30 AM Coral Ceo, DPT [facility]   04/18/2015 5:15 PM Coral Ceo, DPT [facility]   04/22/2015 7:30 AM Coral Ceo, DPT [facility]   04/25/2015 5:15 PM Coral Ceo, DPT [facility]     Please explain any variance from above plan of care:  Progression/Medical Necessity:   ?? Patient demonstrates good rehab potential due to higher previous functional level.  Compliance with Program/Exercises: compliant all of the time.   Reason for Continuation of Services/Other Comments:  ?? Patient continues to require skilled intervention due to pain with prolonged sitting and poor posture.  Recommendations/Intent for next treatment session: "Treatment next visit will focus on advancements to more challenging activities and reduction in assistance provided".    Total Treatment Duration:  PT Patient Time In/Time Out  Time In: 1500  Time Out: [place] Simona Huh, DPT

## 2015-03-25 ENCOUNTER — Inpatient Hospital Stay: Admit: 2015-03-25 | Payer: PRIVATE HEALTH INSURANCE | Primary: Internal Medicine

## 2015-03-25 NOTE — Progress Notes (Signed)
[facility]rapy Center at [facility]    [address] [place] [address]  Phone:[phone]    [phone]  Outpatient PHYSICAL THERAPY: Daily Note 03/25/2015   Fall Risk Score: 1 (? 5 = High Risk)    ICD-10: Treatment Diagnosis: Low back pain (M54.5)  REFERRING PHYSICIAN: Inis Sizer, NP  MD Orders: Evaluate and Treat  Return Physician Appointment: -  MEDICAL/REFERRING DIAGNOSIS: chronic bilateral low back pain without sciatica  DATE OF ONSET: 1 year ago  PRIOR LEVEL OF FUNCTION: independent and active  PRECAUTIONS/ALLERGIES: No Known Allergies  ASSESSMENT:  ????????This section established at most recent assessment??????????  Patient presents to physical therapy with a 1 year history of central low back pain. Physical therapy evaluation demonstrates centralized and reduced pain with postural correction, and decreased pain with extension based exercises indicating a posterior derangement with underling postural syndrome.   PROBLEM LIST (Impairments causing functional limitations):  1. Decreased Strength affecting function  2. Decreased ADL/Functional Activities  3. Decreased Flexibility/joint mobility  4. Decreased transfer abilities  5. Increased Pain affecting function  6. Decreased Activity tolerance   GOALS: (Goals have been discussed and agreed upon with patient.)  Short Term Goals  1. Pt will be independent with comprehensive HEP to centralize and reduce low back pain  2. Pt will participate in LE stretching program to increase flexibility  3. Pt will participate in core stabilization exercises to help with stabilization during ADLs  4. Pt will participate in LE strengthening program with weights as appropriate to help with gait and elevations  5. Pt will participate in static and dynamic balance activities to decrease the risk for falls  6. Pt will tolerate manual therapy/joint mobilizations/soft tissue to increase ROM and decrease pain   7. Pt will report <5/10 pain to demonstrate improvement in function  8. Pt will demonstrate a 5 point decreased on the Modified Oswestry Low Back Disability Questionnaire to show improvement in function  Long Term Goals  1. Pt will demonstrate a 10 point decrease in the Modified Oswestry Low Back Disability Questionnaire to show improvement in function  2. Pt will report 0/10 pain at rest and during ADLs  Pt will be able to flex and extend lumbar spine without peripheralization of symptoms to demonstrate reduced derangement   REHABILITATION POTENTIAL FOR STATED GOALS: GoodPLAN OF CARE:  INTERVENTIONS PLANNED: (Benefits and precautions of physical therapy have been discussed with the patient.)  1. balance exercise  2. cold  3. electrical stimulation  4. family education  5. heat  6. home exercise program (HEP)  7. manual therapy  8. range of motion: active/assisted/passive  9. therapeutic exercise/strengthening  TREATMENT PLAN EFFECTIVE DATES: 03-15-15 TO 06-15-15  FREQUENCY/DURATION: Follow patient 2 times a week for 4-6 weeks to address above goals.  Regarding [employer] therapy, I certify that the treatment plan above will be carried out by a therapist or under their direction.  Thank you for this referral,  Algernon Huxley. Simona Huh, DPT     Referring Physician Signature: Inis Sizer, NP          Date                       SUBJECTIVE:    History of Present Injury/Illness (Reason for Referral): Patient reports having back pain for over 1 year. 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 Pain is increased with prolonged sitting. Has to get up to ease the pain. Pain  is better with movement. Pain is central low back with no radiation to hips. Pain is intermittent. Lying down and bending does not change symptoms.Patient reports being able to lift weights and ride the eliptical and pain is not aggravated. Has had  no imaging. Denies numbness/tingling. Takes advil for pain and it seems to help.   Present Symptoms: 03/25/2015 Patient reports his back was better over the last week except for yesterday when he sat in the car for a long time the back got stiff and achy.   Pain Intensity 1: 3  Dominant Side: right  Past Medical History:   Past Medical History   Diagnosis Date   ??? Atopic dermatitis    ??? Hypertension    ??? Insomnia      Current Medications:   Current Outpatient Prescriptions on File Prior to Encounter   Medication Sig Dispense Refill   ??? losartan (COZAAR) 50 mg tablet Take 1 Tab by mouth two (2) times a day. 360 Tab 1   ??? lovastatin (MEVACOR) 20 mg tablet Take 1 Tab by mouth nightly. 180 Tab 1   ??? traZODone (DESYREL) 50 mg tablet Take 1 Tab by mouth nightly. 90 Tab 0   ??? zolpidem (AMBIEN) 10 mg tablet Take 1 Tab by mouth nightly as needed for Sleep. 90 Tab 3   ??? mometasone (ELOCON) 0.1 % topical cream Apply  to affected area two (2) times a day. Indications: ATOPIC DERMATITIS 15 g 3   ??? b complex vitamins tablet Take 1 tablet by mouth daily.     ??? ascorbic acid (VITAMIN C) 500 mg tablet Take 1,000 mg by mouth daily.     ??? cholecalciferol, vitamin D3, (VITAMIN D3) 2,000 unit tab Take  by mouth.     ??? Glucosam-Chon-Collag-Hyalur Ac 375-300-50-2 mg cap Take  by mouth daily.     ??? ferrous sulfate (IRON) 325 mg (65 mg iron) tablet Take  by mouth Daily (before breakfast).     ??? OTHER Apple Pectin takes 1 tablet once daily     ??? coenzyme q10-vitamin e (COQ10 SG 100) 100-100 mg-unit cap Take  by mouth.       No current facility-administered medications on file prior to encounter.       Date Last Reviewed: 03/25/2015   Social History/Home Situation: live with family, no barriers     Work/Activity History: full time, Government social research officer  OBJECTIVE:    Outcome Measure:   Tool Used: Modified Oswestry Low Back Pain Questionnaire  Score:  Initial: 14/50  Most Recent: X/50 (Date: -- )        Observation/Orthostatic Postural Assessment:    In sitting patient with slouched posture. Postural correction reduces and centralizes pain. In standing patient with reduced lumbar lordosis. All pelvic landmarks level  Palpation:    extension mobilization- increased, NW  ROM:   Lumbar Movement Loss:  Flexion- min  Extension- nil  Side Glide Right- nil  Side Glide Left- nil                       Strength:  Right LE:  Hip Flexion 5/5  Hip Extension 5/5  Hip Abduction 5/5  Knee Flexion 5/5  Knee Extension 5/5  Ankle Dorsiflexion 5/5  Ankle Plantarflexion 5/5 Left LE:  Hip Flexion 5/5  Hip Extension 5/5  Hip Abduction 5/5  Knee Flexion 5/5  Knee Extension 5/5  Ankle Dorsiflexion 5/5  Ankle Plantarflexion 5/5      Special Tests:  B=Better NB= No Better S=Same W=Worse NW= No Worse  A= Abolish P=Produces (+) =increasses  (-) = decreases  Activity     reps  during  after  Activity recheck     Activity recheck        Flexion in Standing 1 increased Worse     Extension in Standing 1 increased NW     Prone Press Ups 10 decreased better     Prone Press Ups with OP 10 increased NW                         Neurological Screen:   Myotomes: intact  Dermatomes: intact  Reflexes: not tested  ?? 0- No response  ?? 1+ Diminished  ?? 2+ Average  ?? 3+ Brisk  ?? 4+ Very brisk    TREATMENT:    (In addition to Assessment/Re-Assessment sessions the following treatments were rendered)  THERAPEUTIC EXERCISE: (30 minutes):  Exercises per grid below to improve mobility, strength and balance.  Required minimal visual, verbal and manual cues to promote proper body alignment and promote proper body posture.  Progressed resistance, range and repetitions as indicated.   Date:  03-15-15 Date:  03-25-15 Date:     Activity/Exercise Parameters Parameters Parameters   Patient Education Discussed HEP (prone press ups 10 times every couple hours, machines to avoid at gym, lumbar support and posture) and POC Discussed continuation of prone press ups     Treadmill  Warmup x 10 minutes    LTR  10 sec hold x 10    Bridge  x10                             B=Better NB= No Better S=Same W=Worse NW= No Worse  A= Abolish P=Produces (+) =increasses  (-) = decreases  Activity     reps  during  after  Activity recheck     Activity recheck        Prone Press Ups 2 x 10 with sag  decreased better     Prone Press Ups with OP  x10 increased NW     Extension Mobilization x5 increased NW     Flexion-Rotation Mobilization x5 No change No change     Sitting Thoracic Extension x10 decreased better                Manual Therapy (     ):   Therapeutic Modalities:                                                                                               HEP: As above; handouts given to patient for all exercises.  ______________________________________________________________________________________________________    Treatment Assessment:  Patient's pain decreased following treadmill warmup and extension based exercises.  Next Scheduled Visit (s):  Future Appointments  Date Time Provider [place]   03/28/2015 5:15 PM Coral Ceo, DPT [facility]   04/08/2015 7:30 AM Coral Ceo, DPT [facility]   04/11/2015 5:15 PM Coral Ceo, DPT [facility]  04/15/2015 7:30 AM Coral Ceo, DPT [facility]   04/18/2015 5:15 PM Coral Ceo, DPT [facility]   04/22/2015 7:30 AM Coral Ceo, DPT [facility]   04/25/2015 5:15 PM Coral Ceo, DPT [facility]     Please explain any variance from above plan of care:  Progression/Medical Necessity:   ?? Patient demonstrates good rehab potential due to higher previous functional level.  Compliance with Program/Exercises: compliant all of the time.   Reason for Continuation of Services/Other Comments:  ?? Patient continues to require skilled intervention due to pain with prolonged sitting and poor posture.   Recommendations/Intent for next treatment session: "Treatment next visit will focus on advancements to more challenging activities and reduction in assistance provided".    Total Treatment Duration:  PT Patient Time In/Time Out  Time In: 0815  Time Out: [place]. Simona Huh, DPT

## 2015-03-28 ENCOUNTER — Inpatient Hospital Stay: Admit: 2015-03-28 | Payer: PRIVATE HEALTH INSURANCE | Primary: Internal Medicine

## 2015-03-28 ENCOUNTER — Encounter: Payer: PRIVATE HEALTH INSURANCE | Primary: Internal Medicine

## 2015-03-28 DIAGNOSIS — M545 Low back pain: Secondary | ICD-10-CM

## 2015-03-28 NOTE — Progress Notes (Signed)
Therapy Center at [facility]    [address] [place] [address]  [phone]    [phone]  Outpatient PHYSICAL THERAPY: Daily Note 03/28/2015   Fall Risk Score: 1 (? 5 = High Risk)    ICD-10: Treatment Diagnosis: Low back pain (M54.5)  REFERRING PHYSICIAN: Inis Sizer, NP  MD Orders: Evaluate and Treat  Return Physician Appointment: -  MEDICAL/REFERRING DIAGNOSIS: chronic bilateral low back pain without sciatica  DATE OF ONSET: 1 year ago  PRIOR LEVEL OF FUNCTION: independent and active  PRECAUTIONS/ALLERGIES: No Known Allergies  ASSESSMENT:  ????????This section established at most recent assessment??????????  Patient presents to physical therapy with a 1 year history of central low back pain. Physical therapy evaluation demonstrates centralized and reduced pain with postural correction, and decreased pain with extension based exercises indicating a posterior derangement with underling postural syndrome.   PROBLEM LIST (Impairments causing functional limitations):  1. Decreased Strength affecting function  2. Decreased ADL/Functional Activities  3. Decreased Flexibility/joint mobility  4. Decreased transfer abilities  5. Increased Pain affecting function  6. Decreased Activity tolerance   GOALS: (Goals have been discussed and agreed upon with patient.)  Short Term Goals  1. Pt will be independent with comprehensive HEP to centralize and reduce low back pain  2. Pt will participate in LE stretching program to increase flexibility  3. Pt will participate in core stabilization exercises to help with stabilization during ADLs  4. Pt will participate in LE strengthening program with weights as appropriate to help with gait and elevations  5. Pt will participate in static and dynamic balance activities to decrease the risk for falls  6. Pt will tolerate manual therapy/joint mobilizations/soft tissue to increase ROM and decrease pain   7. Pt will report <5/10 pain to demonstrate improvement in function  8. Pt will demonstrate a 5 point decreased on the Modified Oswestry Low Back Disability Questionnaire to show improvement in function  Long Term Goals  1. Pt will demonstrate a 10 point decrease in the Modified Oswestry Low Back Disability Questionnaire to show improvement in function  2. Pt will report 0/10 pain at rest and during ADLs  Pt will be able to flex and extend lumbar spine without peripheralization of symptoms to demonstrate reduced derangement   REHABILITATION POTENTIAL FOR STATED GOALS: GoodPLAN OF CARE:  INTERVENTIONS PLANNED: (Benefits and precautions of physical therapy have been discussed with the patient.)  1. balance exercise  2. cold  3. electrical stimulation  4. family education  5. heat  6. home exercise program (HEP)  7. manual therapy  8. range of motion: active/assisted/passive  9. therapeutic exercise/strengthening  TREATMENT PLAN EFFECTIVE DATES: 03-15-15 TO 06-15-15  FREQUENCY/DURATION: Follow patient 2 times a week for 4-6 weeks to address above goals.  Regarding [employer] therapy, I certify that the treatment plan above will be carried out by a therapist or under their direction.  Thank you for this referral,  Algernon Huxley. Simona Huh, DPT     Referring Physician Signature: Inis Sizer, NP          Date                       SUBJECTIVE:    History of Present Injury/Illness (Reason for Referral): Patient reports having back pain for over 1 year. 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 Pain is increased with prolonged sitting. Has to get up to ease the pain. Pain  is better with movement. Pain is central low back with no radiation to hips. Pain is intermittent. Lying down and bending does not change symptoms.Patient reports being able to lift weights and ride the eliptical and pain is not aggravated. Has had  no imaging. Denies numbness/tingling. Takes advil for pain and it seems to help.   Present Symptoms: 03/28/2015 Patient reports his back was better over the last week except for yesterday when he sat in the car for a long time the back got stiff and achy.   Pain Intensity 1: 0  Dominant Side: right  Past Medical History:   Past Medical History   Diagnosis Date   ??? Atopic dermatitis    ??? Hypertension    ??? Insomnia      Current Medications:   Current Outpatient Prescriptions on File Prior to Encounter   Medication Sig Dispense Refill   ??? losartan (COZAAR) 50 mg tablet Take 1 Tab by mouth two (2) times a day. 360 Tab 1   ??? lovastatin (MEVACOR) 20 mg tablet Take 1 Tab by mouth nightly. 180 Tab 1   ??? traZODone (DESYREL) 50 mg tablet Take 1 Tab by mouth nightly. 90 Tab 0   ??? zolpidem (AMBIEN) 10 mg tablet Take 1 Tab by mouth nightly as needed for Sleep. 90 Tab 3   ??? mometasone (ELOCON) 0.1 % topical cream Apply  to affected area two (2) times a day. Indications: ATOPIC DERMATITIS 15 g 3   ??? b complex vitamins tablet Take 1 tablet by mouth daily.     ??? ascorbic acid (VITAMIN C) 500 mg tablet Take 1,000 mg by mouth daily.     ??? cholecalciferol, vitamin D3, (VITAMIN D3) 2,000 unit tab Take  by mouth.     ??? Glucosam-Chon-Collag-Hyalur Ac 375-300-50-2 mg cap Take  by mouth daily.     ??? ferrous sulfate (IRON) 325 mg (65 mg iron) tablet Take  by mouth Daily (before breakfast).     ??? OTHER Apple Pectin takes 1 tablet once daily     ??? coenzyme q10-vitamin e (COQ10 SG 100) 100-100 mg-unit cap Take  by mouth.       No current facility-administered medications on file prior to encounter.       Date Last Reviewed: 03/28/2015   Social History/Home Situation: live with family, no barriers     Work/Activity History: full time, Government social research officer  OBJECTIVE:    Outcome Measure:   Tool Used: Modified Oswestry Low Back Pain Questionnaire  Score:  Initial: 14/50  Most Recent: X/50 (Date: -- )        Observation/Orthostatic Postural Assessment:    In sitting patient with slouched posture. Postural correction reduces and centralizes pain. In standing patient with reduced lumbar lordosis. All pelvic landmarks level  Palpation:    extension mobilization- increased, NW  ROM:   Lumbar Movement Loss:  Flexion- min  Extension- nil  Side Glide Right- nil  Side Glide Left- nil                       Strength:  Right LE:  Hip Flexion 5/5  Hip Extension 5/5  Hip Abduction 5/5  Knee Flexion 5/5  Knee Extension 5/5  Ankle Dorsiflexion 5/5  Ankle Plantarflexion 5/5 Left LE:  Hip Flexion 5/5  Hip Extension 5/5  Hip Abduction 5/5  Knee Flexion 5/5  Knee Extension 5/5  Ankle Dorsiflexion 5/5  Ankle Plantarflexion 5/5      Special Tests:  B=Better NB= No Better S=Same W=Worse NW= No Worse  A= Abolish P=Produces (+) =increasses  (-) = decreases  Activity     reps  during  after  Activity recheck     Activity recheck        Flexion in Standing 1 increased Worse     Extension in Standing 1 increased NW     Prone Press Ups 10 decreased better     Prone Press Ups with OP 10 increased NW                         Neurological Screen:   Myotomes: intact  Dermatomes: intact  Reflexes: not tested  ?? 0- No response  ?? 1+ Diminished  ?? 2+ Average  ?? 3+ Brisk  ?? 4+ Very brisk    TREATMENT:    (In addition to Assessment/Re-Assessment sessions the following treatments were rendered)  THERAPEUTIC EXERCISE: (30 minutes):  Exercises per grid below to improve mobility, strength and balance.  Required minimal visual, verbal and manual cues to promote proper body alignment and promote proper body posture.  Progressed resistance, range and repetitions as indicated.   Date:  03-15-15 Date:  03-25-15 Date:  03-28-15   Activity/Exercise Parameters Parameters Parameters   Patient Education Discussed HEP (prone press ups 10 times every couple hours, machines to avoid at gym, lumbar support and posture) and POC Discussed continuation of prone press ups     Treadmill  Warmup x 10 minutes Warmup x 10 minutes   LTR  10 sec hold x 10 10 sec hold x 10   Bridge  x10   x10   Quadruped Leg Extension     x10 each leg   Quadruped Alternating Leg/Arm Extension   x10 each side   Iso Abdominal   5 sec hold x 10     Scapular Retractions for posture   5 sec hold x 10          B=Better NB= No Better S=Same W=Worse NW= No Worse  A= Abolish P=Produces (+) =increasses  (-) = decreases  Activity     reps  during  after  Activity recheck     Activity recheck        Prone Press Ups 2 x 10 with sag  decreased better     Prone Press Ups with OP  x10 decreased better     Extension Mobilization x5 increased NW     Standing Extension over fulcrum x10 decreased better                        Manual Therapy (     ):   Therapeutic Modalities:                                                                                               HEP: As above; handouts given to patient for all exercises.  ______________________________________________________________________________________________________    Treatment Assessment:  Patient able to tolerate all exercises today without pain. Improvement in low back mobility  Next Scheduled Visit (s):  Future Appointments  Date Time Provider [place]   04/08/2015 7:30 AM Coral Ceo, DPT [facility]   04/11/2015 5:15 PM Coral Ceo, DPT [facility]   04/15/2015 7:30 AM Coral Ceo, DPT [facility]   04/18/2015 5:15 PM Coral Ceo, DPT [facility]   04/22/2015 7:30 AM Coral Ceo, DPT [facility]   04/25/2015 5:15 PM Coral Ceo, DPT [facility]     Please explain any variance from above plan of care:  Progression/Medical Necessity:   ?? Patient demonstrates good rehab potential due to higher previous functional level.  Compliance with Program/Exercises: compliant all of the time.   Reason for Continuation of Services/Other Comments:   ?? Patient continues to require skilled intervention due to pain with prolonged sitting and poor posture.  Recommendations/Intent for next treatment session: "Treatment next visit will focus on advancements to more challenging activities and reduction in assistance provided".    Total Treatment Duration:  PT Patient Time In/Time Out  Time In: 1715  Time Out: 1745    Allison Silva M. Simona Huh, DPT

## 2015-04-08 ENCOUNTER — Inpatient Hospital Stay: Admit: 2015-04-08 | Payer: PRIVATE HEALTH INSURANCE | Primary: Internal Medicine

## 2015-04-08 NOTE — Progress Notes (Signed)
[facility]    [address], [place] [address]  [phone]    [phone]  Outpatient PHYSICAL THERAPY: Daily Note 04/08/2015   Fall Risk Score: 1 (? 5 = High Risk)    ICD-10: Treatment Diagnosis: Low back pain (M54.5)  REFERRING PHYSICIAN: Inis Sizer, NP  MD Orders: Evaluate and Treat  Return Physician Appointment: -  MEDICAL/REFERRING DIAGNOSIS: chronic bilateral low back pain without sciatica  DATE OF ONSET: 1 year ago  PRIOR LEVEL OF FUNCTION: independent and active  PRECAUTIONS/ALLERGIES: No Known Allergies  ASSESSMENT:  ????????This section established at most recent assessment??????????  Patient presents to physical therapy with a 1 year history of central low back pain. Physical therapy evaluation demonstrates centralized and reduced pain with postural correction, and decreased pain with extension based exercises indicating a posterior derangement with underling postural syndrome.   PROBLEM LIST (Impairments causing functional limitations):  1. Decreased Strength affecting function  2. Decreased ADL/Functional Activities  3. Decreased Flexibility/joint mobility  4. Decreased transfer abilities  5. Increased Pain affecting function  6. Decreased Activity tolerance   GOALS: (Goals have been discussed and agreed upon with patient.)  Short Term Goals  1. Pt will be independent with comprehensive HEP to centralize and reduce low back pain  2. Pt will participate in LE stretching program to increase flexibility  3. Pt will participate in core stabilization exercises to help with stabilization during ADLs  4. Pt will participate in LE strengthening program with weights as appropriate to help with gait and elevations  5. Pt will participate in static and dynamic balance activities to decrease the risk for falls  6. Pt will tolerate manual therapy/joint mobilizations/soft tissue to increase ROM and decrease pain   7. Pt will report <5/10 pain to demonstrate improvement in function  8. Pt will demonstrate a 5 point decreased on the Modified Oswestry Low Back Disability Questionnaire to show improvement in function  Long Term Goals  1. Pt will demonstrate a 10 point decrease in the Modified Oswestry Low Back Disability Questionnaire to show improvement in function  2. Pt will report 0/10 pain at rest and during ADLs  Pt will be able to flex and extend lumbar spine without peripheralization of symptoms to demonstrate reduced derangement   REHABILITATION POTENTIAL FOR STATED GOALS: GoodPLAN OF CARE:  INTERVENTIONS PLANNED: (Benefits and precautions of physical therapy have been discussed with the patient.)  1. balance exercise  2. cold  3. electrical stimulation  4. family education  5. heat  6. home exercise program (HEP)  7. manual therapy  8. range of motion: active/assisted/passive  9. therapeutic exercise/strengthening  TREATMENT PLAN EFFECTIVE DATES: 03-15-15 TO 06-15-15  FREQUENCY/DURATION: Follow patient 2 times a week for 4-6 weeks to address above goals.  Regarding [employer] therapy, I certify that the treatment plan above will be carried out by a therapist or under their direction.  Thank you for this referral,  Algernon Huxley. Simona Huh, DPT     Referring Physician Signature: Inis Sizer, NP          Date                       SUBJECTIVE:    History of Present Injury/Illness (Reason for Referral): Patient reports having back pain for over 1 year. 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 Pain is increased with prolonged sitting. Has to get up to ease the pain. Pain  is better with movement. Pain is central low back with no radiation to hips. Pain is intermittent. Lying down and bending does not change symptoms.Patient reports being able to lift weights and ride the eliptical and pain is not aggravated. Has had  no imaging. Denies numbness/tingling. Takes advil for pain and it seems to help.   Present Symptoms: 04/08/2015 Patient reports his soreness and stiffness takes longer to come on especially when driving.    Pain Intensity 1: 1  Dominant Side: right  Past Medical History:   Past Medical History   Diagnosis Date   ??? Atopic dermatitis    ??? Hypertension    ??? Insomnia      Current Medications:   Current Outpatient Prescriptions on File Prior to Encounter   Medication Sig Dispense Refill   ??? losartan (COZAAR) 50 mg tablet Take 1 Tab by mouth two (2) times a day. 360 Tab 1   ??? lovastatin (MEVACOR) 20 mg tablet Take 1 Tab by mouth nightly. 180 Tab 1   ??? traZODone (DESYREL) 50 mg tablet Take 1 Tab by mouth nightly. 90 Tab 0   ??? zolpidem (AMBIEN) 10 mg tablet Take 1 Tab by mouth nightly as needed for Sleep. 90 Tab 3   ??? mometasone (ELOCON) 0.1 % topical cream Apply  to affected area two (2) times a day. Indications: ATOPIC DERMATITIS 15 g 3   ??? b complex vitamins tablet Take 1 tablet by mouth daily.     ??? ascorbic acid (VITAMIN C) 500 mg tablet Take 1,000 mg by mouth daily.     ??? cholecalciferol, vitamin D3, (VITAMIN D3) 2,000 unit tab Take  by mouth.     ??? Glucosam-Chon-Collag-Hyalur Ac 375-300-50-2 mg cap Take  by mouth daily.     ??? ferrous sulfate (IRON) 325 mg (65 mg iron) tablet Take  by mouth Daily (before breakfast).     ??? OTHER Apple Pectin takes 1 tablet once daily     ??? coenzyme q10-vitamin e (COQ10 SG 100) 100-100 mg-unit cap Take  by mouth.       No current facility-administered medications on file prior to encounter.       Date Last Reviewed: 04/08/2015   Social History/Home Situation: live with family, no barriers     Work/Activity History: full time, Government social research officer  OBJECTIVE:    Outcome Measure:   Tool Used: Modified Oswestry Low Back Pain Questionnaire  Score:  Initial: 14/50  Most Recent: X/50 (Date: -- )       Observation/Orthostatic Postural Assessment:    In sitting patient with  slouched posture. Postural correction reduces and centralizes pain. In standing patient with reduced lumbar lordosis. All pelvic landmarks level  Palpation:    extension mobilization- increased, NW  ROM:   Lumbar Movement Loss:  Flexion- min  Extension- nil  Side Glide Right- nil  Side Glide Left- nil                       Strength:  Right LE:  Hip Flexion 5/5  Hip Extension 5/5  Hip Abduction 5/5  Knee Flexion 5/5  Knee Extension 5/5  Ankle Dorsiflexion 5/5  Ankle Plantarflexion 5/5 Left LE:  Hip Flexion 5/5  Hip Extension 5/5  Hip Abduction 5/5  Knee Flexion 5/5  Knee Extension 5/5  Ankle Dorsiflexion 5/5  Ankle Plantarflexion 5/5      Special Tests:     B=Better NB= No Better S=Same W=Worse NW= No Worse  A= Abolish P=Produces (+) =increasses  (-) = decreases  Activity     reps  during  after  Activity recheck     Activity recheck        Flexion in Standing 1 increased Worse     Extension in Standing 1 increased NW     Prone Press Ups 10 decreased better     Prone Press Ups with OP 10 increased NW                         Neurological Screen:   Myotomes: intact  Dermatomes: intact  Reflexes: not tested  ?? 0- No response  ?? 1+ Diminished  ?? 2+ Average  ?? 3+ Brisk  ?? 4+ Very brisk    TREATMENT:    (In addition to Assessment/Re-Assessment sessions the following treatments were rendered)  THERAPEUTIC EXERCISE: (30 minutes):  Exercises per grid below to improve mobility, strength and balance.  Required minimal visual, verbal and manual cues to promote proper body alignment and promote proper body posture.  Progressed resistance, range and repetitions as indicated.   Date:  03-15-15 Date:  03-25-15 Date:  03-28-15   Activity/Exercise Parameters Parameters Parameters   Patient Education Discussed HEP (prone press ups 10 times every couple hours, machines to avoid at gym, lumbar support and posture) and POC Discussed continuation of prone press ups    Treadmill  Warmup x 10 minutes Warmup x 10 minutes    LTR  10 sec hold x 10 10 sec hold x 10   Bridge  x10   x10   Quadruped Leg Extension     x10 each leg   Quadruped Alternating Leg/Arm Extension   x10 each side   Iso Abdominal   5 sec hold x 10     SKTC   15 sec hold x 3     Scapular Retractions for posture   5 sec hold x 10          B=Better NB= No Better S=Same W=Worse NW= No Worse  A= Abolish P=Produces (+) =increasses  (-) = decreases  Activity     reps  during  after  Activity recheck     Activity recheck        Prone Press Ups 2 x 10 with sag  decreased better     Prone Press Ups with OP  x10 decreased better     Extension Mobilization x5 increased NW     Standing Extension over fulcrum x10 decreased better                        Manual Therapy (     ):   Therapeutic Modalities:                                                                                               HEP: As above; handouts given to patient for all exercises.  ______________________________________________________________________________________________________    Treatment Assessment:  Patient demonstrates improvement in lumbar ROM and decreased stiffness with extension mobilization.   Next Scheduled Visit (  s):  Future Appointments  Date Time Provider [place]   04/11/2015 5:15 PM Coral Ceo, DPT [facility]   04/15/2015 7:30 AM Coral Ceo, DPT [facility]   04/18/2015 5:15 PM Coral Ceo, DPT [facility]   04/22/2015 7:30 AM Coral Ceo, DPT [facility]   04/25/2015 5:15 PM Coral Ceo, DPT [facility]     Please explain any variance from above plan of care:  Progression/Medical Necessity:   ?? Patient demonstrates good rehab potential due to higher previous functional level.  Compliance with Program/Exercises: compliant all of the time.   Reason for Continuation of Services/Other Comments:  ?? Patient continues to require skilled intervention due to pain with prolonged sitting and poor posture.   Recommendations/Intent for next treatment session: "Treatment next visit will focus on advancements to more challenging activities and reduction in assistance provided".    Total Treatment Duration:  PT Patient Time In/Time Out  Time In: 0730  Time Out: [place]. Simona Huh, DPT

## 2015-04-11 NOTE — Progress Notes (Signed)
[facility] at [facility]    [address] [place] [address]  Phone:[phone]    [phone]  Outpatient PHYSICAL THERAPY: Daily Note 04/11/2015   Fall Risk Score: 1 (? 5 = High Risk)    ICD-10: Treatment Diagnosis: Low back pain (M54.5)  REFERRING PHYSICIAN: Inis Sizer, NP  MD Orders: Evaluate and Treat  Return Physician Appointment: -  MEDICAL/REFERRING DIAGNOSIS: chronic bilateral low back pain without sciatica  DATE OF ONSET: 1 year ago  PRIOR LEVEL OF FUNCTION: independent and active  PRECAUTIONS/ALLERGIES: No Known Allergies  ASSESSMENT:  ????????This section established at most recent assessment??????????  Patient presents to physical therapy with a 1 year history of central low back pain. Physical therapy evaluation demonstrates centralized and reduced pain with postural correction, and decreased pain with extension based exercises indicating a posterior derangement with underling postural syndrome.   PROBLEM LIST (Impairments causing functional limitations):  1. Decreased Strength affecting function  2. Decreased ADL/Functional Activities  3. Decreased Flexibility/joint mobility  4. Decreased transfer abilities  5. Increased Pain affecting function  6. Decreased Activity tolerance   GOALS: (Goals have been discussed and agreed upon with patient.)  Short Term Goals  1. Pt will be independent with comprehensive HEP to centralize and reduce low back pain  2. Pt will participate in LE stretching program to increase flexibility  3. Pt will participate in core stabilization exercises to help with stabilization during ADLs  4. Pt will participate in LE strengthening program with weights as appropriate to help with gait and elevations  5. Pt will participate in static and dynamic balance activities to decrease the risk for falls  6. Pt will tolerate manual therapy/joint mobilizations/soft tissue to increase ROM and decrease pain   7. Pt will report <5/10 pain to demonstrate improvement in function  8. Pt will demonstrate a 5 point decreased on the Modified Oswestry Low Back Disability Questionnaire to show improvement in function  Long Term Goals  1. Pt will demonstrate a 10 point decrease in the Modified Oswestry Low Back Disability Questionnaire to show improvement in function  2. Pt will report 0/10 pain at rest and during ADLs  Pt will be able to flex and extend lumbar spine without peripheralization of symptoms to demonstrate reduced derangement   REHABILITATION POTENTIAL FOR STATED GOALS: GoodPLAN OF CARE:  INTERVENTIONS PLANNED: (Benefits and precautions of physical therapy have been discussed with the patient.)  1. balance exercise  2. cold  3. electrical stimulation  4. family education  5. heat  6. home exercise program (HEP)  7. manual therapy  8. range of motion: active/assisted/passive  9. therapeutic exercise/strengthening  TREATMENT PLAN EFFECTIVE DATES: 03-15-15 TO 06-15-15  FREQUENCY/DURATION: Follow patient 2 times a week for 4-6 weeks to address above goals.  Regarding [employer] therapy, I certify that the treatment plan above will be carried out by a therapist or under their direction.  Thank you for this referral,  Willie Stewart. Simona Huh, DPT     Referring Physician Signature: Inis Sizer, NP          Date                       SUBJECTIVE:    History of Present Injury/Illness (Reason for Referral): Patient reports having back pain for over 1 year. 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 Pain is increased with prolonged sitting. Has to get up to ease the pain. Pain  is better with movement. Pain is central low back with no radiation to hips. Pain is intermittent. Lying down and bending does not change symptoms.Patient reports being able to lift weights and ride the eliptical and pain is not aggravated. Has had  no imaging. Denies numbness/tingling. Takes advil for pain and it seems to help.   Present Symptoms: 04/11/2015 Patient reports minor sitffness today   Pain Intensity 1: 0  Dominant Side: right  Past Medical History:   Past Medical History   Diagnosis Date   ??? Atopic dermatitis    ??? Hypertension    ??? Insomnia      Current Medications:   Current Outpatient Prescriptions on File Prior to Encounter   Medication Sig Dispense Refill   ??? losartan (COZAAR) 50 mg tablet Take 1 Tab by mouth two (2) times a day. 360 Tab 1   ??? lovastatin (MEVACOR) 20 mg tablet Take 1 Tab by mouth nightly. 180 Tab 1   ??? traZODone (DESYREL) 50 mg tablet Take 1 Tab by mouth nightly. 90 Tab 0   ??? zolpidem (AMBIEN) 10 mg tablet Take 1 Tab by mouth nightly as needed for Sleep. 90 Tab 3   ??? mometasone (ELOCON) 0.1 % topical cream Apply  to affected area two (2) times a day. Indications: ATOPIC DERMATITIS 15 g 3   ??? b complex vitamins tablet Take 1 tablet by mouth daily.     ??? ascorbic acid (VITAMIN C) 500 mg tablet Take 1,000 mg by mouth daily.     ??? cholecalciferol, vitamin D3, (VITAMIN D3) 2,000 unit tab Take  by mouth.     ??? Glucosam-Chon-Collag-Hyalur Ac 375-300-50-2 mg cap Take  by mouth daily.     ??? ferrous sulfate (IRON) 325 mg (65 mg iron) tablet Take  by mouth Daily (before breakfast).     ??? OTHER Apple Pectin takes 1 tablet once daily     ??? coenzyme q10-vitamin e (COQ10 SG 100) 100-100 mg-unit cap Take  by mouth.       No current facility-administered medications on file prior to encounter.       Date Last Reviewed: 04/11/2015   Social History/Home Situation: live with family, no barriers     Work/Activity History: full time, Government social research officer  OBJECTIVE:    Outcome Measure:   Tool Used: Modified Oswestry Low Back Pain Questionnaire  Score:  Initial: 14/50  Most Recent: X/50 (Date: -- )       Observation/Orthostatic Postural Assessment:    In sitting patient with slouched posture. Postural correction reduces and centralizes pain. In  standing patient with reduced lumbar lordosis. All pelvic landmarks level  Palpation:    extension mobilization- increased, NW  ROM:   Lumbar Movement Loss:  Flexion- min  Extension- nil  Side Glide Right- nil  Side Glide Left- nil                       Strength:  Right LE:  Hip Flexion 5/5  Hip Extension 5/5  Hip Abduction 5/5  Knee Flexion 5/5  Knee Extension 5/5  Ankle Dorsiflexion 5/5  Ankle Plantarflexion 5/5 Left LE:  Hip Flexion 5/5  Hip Extension 5/5  Hip Abduction 5/5  Knee Flexion 5/5  Knee Extension 5/5  Ankle Dorsiflexion 5/5  Ankle Plantarflexion 5/5      Special Tests:     B=Better NB= No Better S=Same W=Worse NW= No Worse  A= Abolish P=Produces (+) =increasses  (-) = decreases  Activity     reps  during  after  Activity recheck     Activity recheck        Flexion in Standing 1 increased Worse     Extension in Standing 1 increased NW     Prone Press Ups 10 decreased better     Prone Press Ups with OP 10 increased NW                         Neurological Screen:   Myotomes: intact  Dermatomes: intact  Reflexes: not tested  ?? 0- No response  ?? 1+ Diminished  ?? 2+ Average  ?? 3+ Brisk  ?? 4+ Very brisk    TREATMENT:    (In addition to Assessment/Re-Assessment sessions the following treatments were rendered)  THERAPEUTIC EXERCISE: (30 minutes):  Exercises per grid below to improve mobility, strength and balance.  Required minimal visual, verbal and manual cues to promote proper body alignment and promote proper body posture.  Progressed resistance, range and repetitions as indicated.   Date:  03-15-15 Date:  03-25-15 Date:  03-28-15 Date:  04-11-15   Activity/Exercise Parameters Parameters Parameters    Patient Education Discussed HEP (prone press ups 10 times every couple hours, machines to avoid at gym, lumbar support and posture) and POC Discussed continuation of prone press ups  Discussed core exercises to do at the gym   Treadmill  Warmup x 10 minutes Warmup x 10 minutes Warmup x 10 minutes    LTR  10 sec hold x 10 10 sec hold x 10 10 sec hold x 10   Bridge  x10   x10 x15 with legs under ball   Single Leg Bridge      Next visit   Quadruped Leg Extension     x10 each leg x10 each leg   Quadruped Alternating Leg/Arm Extension   x10 each side x10 each side   Iso Abdominal   5 sec hold x 10   5 sec hold x 10   SKTC   15 sec hold x 3   15 sec hold x 3   Scapular Retractions for posture   5 sec hold x 10   5 sec hold x 10   Cable Cross    7# each direction x 10   Walk outs cable    7# x 5 each direction               B=Better NB= No Better S=Same W=Worse NW= No Worse  A= Abolish P=Produces (+) =increasses  (-) = decreases  Activity     reps  during  after  Activity recheck     Activity recheck        Prone Press Ups 2 x 10 with sag  decreased better     Prone Press Ups with OP  x10 decreased better     Extension Mobilization x5 increased NW     Standing Extension over fulcrum x10 decreased better                        Manual Therapy (     ):   Therapeutic Modalities:  HEP: As above; handouts given to patient for all exercises.  ______________________________________________________________________________________________________    Treatment Assessment:  Patient able to tolerate all core exercises today.   Next Scheduled Visit (s):  Future Appointments  Date Time Provider [place]   04/11/2015 5:15 PM Coral Ceo, DPT [facility]   04/15/2015 7:30 AM Coral Ceo, DPT [facility]   04/18/2015 5:15 PM Coral Ceo, DPT [facility]   04/22/2015 7:30 AM Coral Ceo, DPT [facility]   04/25/2015 5:15 PM Coral Ceo, DPT [facility]     Please explain any variance from above plan of care:  Progression/Medical Necessity:   ?? Patient demonstrates good rehab potential due to higher previous functional level.   Compliance with Program/Exercises: compliant all of the time.   Reason for Continuation of Services/Other Comments:  ?? Patient continues to require skilled intervention due to pain with prolonged sitting and poor posture.  Recommendations/Intent for next treatment session: "Treatment next visit will focus on advancements to more challenging activities and reduction in assistance provided".    Total Treatment Duration:  PT Patient Time In/Time Out  Time In: 1715  Time Out: 1745    Alizea Pell M. Simona Huh, DPT

## 2015-04-12 ENCOUNTER — Inpatient Hospital Stay: Payer: PRIVATE HEALTH INSURANCE | Primary: Internal Medicine

## 2015-04-15 ENCOUNTER — Encounter: Payer: PRIVATE HEALTH INSURANCE | Primary: Internal Medicine

## 2015-04-18 ENCOUNTER — Inpatient Hospital Stay: Admit: 2015-04-18 | Payer: PRIVATE HEALTH INSURANCE | Primary: Internal Medicine

## 2015-04-18 NOTE — Progress Notes (Signed)
[facility]    [address] [place] [address]  Phone:[phone]    [phone]  Outpatient PHYSICAL THERAPY: Daily Note and Discharge 04/18/2015   Fall Risk Score: 1 (? 5 = High Risk)    ICD-10: Treatment Diagnosis: Low back pain (M54.5)  REFERRING PHYSICIAN: Inis Sizer, NP  MD Orders: Evaluate and Treat  Return Physician Appointment: -  MEDICAL/REFERRING DIAGNOSIS: chronic bilateral low back pain without sciatica  DATE OF ONSET: 1 year ago  PRIOR LEVEL OF FUNCTION: independent and active  PRECAUTIONS/ALLERGIES: No Known Allergies  ASSESSMENT:  ????????This section established at most recent assessment??????????  Patient has been seen for 6 physical therapy visits since his initial evaluation on August 19th, 2016. He has participated in extension based exercises as well as core stabilization exercises to reduce pain. Patient reports he is consistent with his HEP and has gone back to the gym. He reports minor stiffness with long distance travel but exercises immediately stops the pain. Patient to be discharged to a home program at this time.   GOALS: (Goals have been discussed and agreed upon with patient.)  Short Term Goals  1. Pt will be independent with comprehensive HEP to centralize and reduce low back pain Goal Met 04-18-15  2. Pt will participate in LE stretching program to increase flexibility Goal Met 04-18-15  3. Pt will participate in core stabilization exercises to help with stabilization during ADLs Goal Met 04-18-15  4. Pt will participate in LE strengthening program with weights as appropriate to help with gait and elevations Goal Met 04-18-15  5. Pt will participate in static and dynamic balance activities to decrease the risk for falls Goal Met 04-18-15  6. Pt will tolerate manual therapy/joint mobilizations/soft tissue to increase ROM and decrease pain Goal Met 04-18-15  7. Pt will report <5/10 pain to demonstrate improvement in function Goal  Met 04-18-15  8. Pt will demonstrate a 5 point decreased on the Modified Oswestry Low Back Disability Questionnaire to show improvement in function Goal Met 04-18-15  Long Term Goals  1. Pt will demonstrate a 10 point decrease in the Modified Oswestry Low Back Disability Questionnaire to show improvement in function Goal Met 04-18-15  2. Pt will report 0/10 pain at rest and during ADLs Goal Met 04-18-15  Pt will be able to flex and extend lumbar spine without peripheralization of symptoms to demonstrate reduced derangement  Goal Met 04-18-15  REHABILITATION POTENTIAL FOR STATED GOALS: GoodPLAN OF CARE:    Regarding Willie Stewart's therapy, I certify that the treatment plan above will be carried out by a therapist or under their direction.  Thank you for this referral,  Algernon Huxley. Simona Huh, DPT     Referring Physician Signature: Inis Sizer, NP          Date                       SUBJECTIVE:    History of Present Injury/Illness (Reason for Referral): Patient reports having back pain for over 1 year. 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 Pain is increased with prolonged sitting. Has to get up to ease the pain. Pain is better with movement. Pain is central low back with no radiation to hips. Pain is intermittent. Lying down and bending does not change symptoms.Patient reports being able to lift weights and ride the eliptical and pain is not aggravated. Has had no imaging. Denies numbness/tingling. Takes  advil for pain and it seems to help.   Present Symptoms: 04/18/2015 Patient reports minor sitffness today following 3 days of air travel  Pain Intensity 1: 0  Dominant Side: right  Past Medical History:   Past Medical History   Diagnosis Date   ??? Atopic dermatitis    ??? Hypertension    ??? Insomnia      Current Medications:   Current Outpatient Prescriptions on File Prior to Encounter   Medication Sig Dispense Refill    ??? losartan (COZAAR) 50 mg tablet Take 1 Tab by mouth two (2) times a day. 360 Tab 1   ??? lovastatin (MEVACOR) 20 mg tablet Take 1 Tab by mouth nightly. 180 Tab 1   ??? traZODone (DESYREL) 50 mg tablet Take 1 Tab by mouth nightly. 90 Tab 0   ??? zolpidem (AMBIEN) 10 mg tablet Take 1 Tab by mouth nightly as needed for Sleep. 90 Tab 3   ??? mometasone (ELOCON) 0.1 % topical cream Apply  to affected area two (2) times a day. Indications: ATOPIC DERMATITIS 15 g 3   ??? b complex vitamins tablet Take 1 tablet by mouth daily.     ??? ascorbic acid (VITAMIN C) 500 mg tablet Take 1,000 mg by mouth daily.     ??? cholecalciferol, vitamin D3, (VITAMIN D3) 2,000 unit tab Take  by mouth.     ??? Glucosam-Chon-Collag-Hyalur Ac 375-300-50-2 mg cap Take  by mouth daily.     ??? ferrous sulfate (IRON) 325 mg (65 mg iron) tablet Take  by mouth Daily (before breakfast).     ??? OTHER Apple Pectin takes 1 tablet once daily     ??? coenzyme q10-vitamin e (COQ10 SG 100) 100-100 mg-unit cap Take  by mouth.       No current facility-administered medications on file prior to encounter.       Date Last Reviewed: 04/18/2015   Social History/Home Situation: live with family, no barriers     Work/Activity History: full time, Government social research officer  OBJECTIVE:    Outcome Measure:   Tool Used: Modified Oswestry Low Back Pain Questionnaire  Score:  Initial: 14/50  Most Recent: X/50 (Date: -- )       Observation/Orthostatic Postural Assessment:    In sitting patient with slouched posture. Postural correction reduces and centralizes pain. In standing patient with reduced lumbar lordosis. All pelvic landmarks level  Palpation:    n/a  ROM:   Lumbar Movement Loss: 04-18-15  Flexion- nil  Extension- nil  Side Glide Right- nil  Side Glide Left- nil                       Strength:04-18-15  Right LE:  Hip Flexion 5/5  Hip Extension 5/5  Hip Abduction 5/5  Knee Flexion 5/5  Knee Extension 5/5  Ankle Dorsiflexion 5/5  Ankle Plantarflexion 5/5 Left LE:  Hip Flexion 5/5  Hip Extension 5/5   Hip Abduction 5/5  Knee Flexion 5/5  Knee Extension 5/5  Ankle Dorsiflexion 5/5  Ankle Plantarflexion 5/5      Special Tests:     B=Better NB= No Better S=Same W=Worse NW= No Worse  A= Abolish P=Produces (+) =increasses  (-) = decreases  Activity     reps  during  after  Activity recheck     Activity recheck        Flexion in Standing 1 increased Worse     Extension in Standing 1 increased NW  Prone Press Ups 10 decreased better     Prone Press Ups with OP 10 increased NW                         Neurological Screen:   Myotomes: intact  Dermatomes: intact  Reflexes: not tested  ?? 0- No response  ?? 1+ Diminished  ?? 2+ Average  ?? 3+ Brisk  ?? 4+ Very brisk    TREATMENT:    (In addition to Assessment/Re-Assessment sessions the following treatments were rendered)  THERAPEUTIC EXERCISE: (30 minutes):  Exercises per grid below to improve mobility, strength and balance.  Required minimal visual, verbal and manual cues to promote proper body alignment and promote proper body posture.  Progressed resistance, range and repetitions as indicated.   Date:  03-15-15 Date:  03-25-15 Date:  03-28-15 Date:  04-11-15 Date:  04-18-15   Activity/Exercise Parameters Parameters Parameters     Patient Education Discussed HEP (prone press ups 10 times every couple hours, machines to avoid at gym, lumbar support and posture) and POC Discussed continuation of prone press ups  Discussed core exercises to do at the gym    Treadmill  Warmup x 10 minutes Warmup x 10 minutes Warmup x 10 minutes Warmup x 10 minutes   LTR  10 sec hold x 10 10 sec hold x 10 10 sec hold x 10 10 sec hold x 10   Bridge  x10   x10 x15 with legs under ball x15 with legs under ball   Single Leg Bridge      Next visit x10 each leg   Quadruped Leg Extension     x10 each leg x10 each leg x10 each leg   Quadruped Alternating Leg/Arm Extension   x10 each side x10 each side x10 each side   Iso Abdominal   5 sec hold x 10   5 sec hold x 10 5 sec hold x 10   SKTC   15 sec hold x 3    15 sec hold x 3 15 sec hold x 3   Scapular Retractions for posture   5 sec hold x 10   5 sec hold x 10    Cable Cross    7# each direction x 10 10# each direction x 10   Walk outs cable    7# x 5 each direction 10# x 5 each direction   Plank     30 sec holds x 3          B=Better NB= No Better S=Same W=Worse NW= No Worse  A= Abolish P=Produces (+) =increasses  (-) = decreases  Activity     reps  during  after  Activity recheck     Activity recheck        Prone Press Ups 2 x 10 with sag  decreased better     Prone Press Ups with OP  x10 decreased better     Extension Mobilization x5 increased NW     Standing Extension over fulcrum x10 decreased better                        Manual Therapy (     ):   Therapeutic Modalities:  HEP: As above; handouts given to patient for all exercises.  ______________________________________________________________________________________________________    Treatment Assessment: See assessment above  Total Treatment Duration:  PT Patient Time In/Time Out  Time In: 3875  Time Out: 1745    Jamaiyah Pyle M. Simona Huh, DPT

## 2015-04-22 ENCOUNTER — Encounter: Payer: PRIVATE HEALTH INSURANCE | Primary: Internal Medicine

## 2015-04-25 ENCOUNTER — Encounter: Payer: PRIVATE HEALTH INSURANCE | Primary: Internal Medicine

## 2015-07-26 ENCOUNTER — Other Ambulatory Visit: Admit: 2015-07-26 | Discharge: 2015-07-26 | Payer: PRIVATE HEALTH INSURANCE | Primary: Internal Medicine

## 2015-07-26 DIAGNOSIS — E785 Hyperlipidemia, unspecified: Secondary | ICD-10-CM

## 2015-07-27 LAB — METABOLIC PANEL, COMPREHENSIVE
A-G Ratio: 2.1 (ref 1.1–2.5)
ALT (SGPT): 23 IU/L (ref 0–44)
AST (SGOT): 23 IU/L (ref 0–40)
Albumin: 4.4 g/dL (ref 3.6–4.8)
Alk. phosphatase: 35 IU/L — ABNORMAL LOW (ref 39–117)
BUN/Creatinine ratio: 16 (ref 10–22)
BUN: 20 mg/dL (ref 8–27)
Bilirubin, total: 0.5 mg/dL (ref 0.0–1.2)
CO2: 25 mmol/L (ref 18–29)
Calcium: 9.4 mg/dL (ref 8.6–10.2)
Chloride: 102 mmol/L (ref 96–106)
Creatinine: 1.23 mg/dL (ref 0.76–1.27)
GFR est AA: 73 mL/min/{1.73_m2} (ref >59)
GFR est non-AA: 63 mL/min/{1.73_m2} (ref >59)
GLOBULIN, TOTAL: 2.1 g/dL (ref 1.5–4.5)
Glucose: 92 mg/dL (ref 65–99)
Potassium: 4.8 mmol/L (ref 3.5–5.2)
Protein, total: 6.5 g/dL (ref 6.0–8.5)
Sodium: 142 mmol/L (ref 134–144)

## 2015-07-27 LAB — CBC WITH AUTOMATED DIFF
ABS. BASOPHILS: 0 10*3/uL (ref 0.0–0.2)
ABS. EOSINOPHILS: 0.1 10*3/uL (ref 0.0–0.4)
ABS. IMM. GRANS.: 0 10*3/uL (ref 0.0–0.1)
ABS. MONOCYTES: 0.6 10*3/uL (ref 0.1–0.9)
ABS. NEUTROPHILS: 2.6 10*3/uL (ref 1.4–7.0)
Abs Lymphocytes: 1.7 10*3/uL (ref 0.7–3.1)
BASOPHILS: 0 %
EOSINOPHILS: 2 %
HCT: 40.6 % (ref 37.5–51.0)
HGB: 13.3 g/dL (ref 12.6–17.7)
IMMATURE GRANULOCYTES: 0 %
Lymphocytes: 34 %
MCH: 29.8 pg (ref 26.6–33.0)
MCHC: 32.8 g/dL (ref 31.5–35.7)
MCV: 91 fL (ref 79–97)
MONOCYTES: 12 %
NEUTROPHILS: 52 %
PLATELET: 166 10*3/uL (ref 150–379)
RBC: 4.46 x10E6/uL (ref 4.14–5.80)
RDW: 14 % (ref 12.3–15.4)
WBC: 5 10*3/uL (ref 3.4–10.8)

## 2015-07-27 LAB — URINALYSIS W/ RFLX MICROSCOPIC
Bilirubin: NEGATIVE
Blood: NEGATIVE
Glucose: NEGATIVE
Ketone: NEGATIVE
Leukocyte Esterase: NEGATIVE
Nitrites: NEGATIVE
Protein: NEGATIVE
Specific Gravity: 1.018 (ref 1.005–1.030)
Urobilinogen: 0.2 mg/dL (ref 0.2–1.0)
pH (UA): 6 (ref 5.0–7.5)

## 2015-07-27 LAB — LIPID PANEL
Cholesterol, total: 225 mg/dL — ABNORMAL HIGH (ref 100–199)
HDL Cholesterol: 64 mg/dL (ref >39)
LDL, calculated: 144 mg/dL — ABNORMAL HIGH (ref 0–99)
Triglyceride: 87 mg/dL (ref 0–149)
VLDL, calculated: 17 mg/dL (ref 5–40)

## 2015-07-27 LAB — CVD REPORT

## 2015-08-12 ENCOUNTER — Ambulatory Visit: Admit: 2015-08-12 | Discharge: 2015-08-12 | Attending: Internal Medicine | Primary: Internal Medicine

## 2015-08-12 DIAGNOSIS — Z0000 Encounter for general adult medical examination without abnormal findings: Secondary | ICD-10-CM

## 2015-08-12 MED ORDER — LOVASTATIN 20 MG TAB
20 mg | ORAL_TABLET | Freq: Every evening | ORAL | 3 refills | Status: DC
Start: 2015-08-12 — End: 2015-08-12

## 2015-08-12 MED ORDER — LOVASTATIN 20 MG TAB
20 mg | ORAL_TABLET | Freq: Every evening | ORAL | 3 refills | Status: DC
Start: 2015-08-12 — End: 2016-11-26

## 2015-08-12 MED ORDER — ZOLPIDEM 10 MG TAB
10 mg | ORAL_TABLET | Freq: Every evening | ORAL | 3 refills | Status: DC | PRN
Start: 2015-08-12 — End: 2015-08-12

## 2015-08-12 MED ORDER — LOSARTAN 50 MG TAB
50 mg | ORAL_TABLET | Freq: Two times a day (BID) | ORAL | 3 refills | Status: DC
Start: 2015-08-12 — End: 2015-08-12

## 2015-08-12 MED ORDER — LOSARTAN 50 MG TAB
50 mg | ORAL_TABLET | Freq: Two times a day (BID) | ORAL | 3 refills | Status: DC
Start: 2015-08-12 — End: 2016-11-26

## 2015-08-12 MED ORDER — ZOLPIDEM 10 MG TAB
10 mg | ORAL_TABLET | Freq: Every evening | ORAL | 3 refills | Status: DC | PRN
Start: 2015-08-12 — End: 2016-11-26

## 2015-08-12 MED ORDER — AMOXICILLIN CLAVULANATE 875 MG-125 MG TAB
875-125 mg | ORAL_TABLET | Freq: Two times a day (BID) | ORAL | 0 refills | Status: DC
Start: 2015-08-12 — End: 2015-08-12

## 2015-08-12 MED ORDER — AMOXICILLIN CLAVULANATE 875 MG-125 MG TAB
875-125 mg | ORAL_TABLET | Freq: Two times a day (BID) | ORAL | 0 refills | Status: DC
Start: 2015-08-12 — End: 2017-06-10

## 2015-08-12 NOTE — Progress Notes (Signed)
Chief Complaint   Patient presents with   ??? Follow-up     discuss lab results        The patient is a 60 y.o. male who is seen for routine checkup.  Previously he was followed by Dr. Patrick North.  He reports she is not having any acute issues.  He is tolerating his blood pressure medications and his blood pressure is well controlled.  He exercises on a regular basis and is a non-smoker.  No chest pain shortness of breath or abdominal pain.    He has occasional insomnia with travel that is alleviated by Ambien.    He does like to have an antibiotic on hand when traveling and requests refill.  He did have an episode of an upper respiratory infection a few months ago when he had taken some Augmentin.    Cholesterol is generally pretty well controlled.  He has been eating more fatty foods and sweets over the holidays since being back at home.    He travels all over the world and his next assignment will be in [place].  He is planning to be there for about a year.      Patient Active Problem List   Diagnosis Code   ??? HTN (hypertension) I10   ??? Hyperlipemia E78.5   ??? Anemia D64.9   ??? 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 CAD (coronary artery disease) I25.10   ??? Hemangioma of liver D18.03      Lab Results   Component Value Date/Time    WBC 5.0 07/26/2015 07:54 AM    HGB 13.3 07/26/2015 07:54 AM    HCT 40.6 07/26/2015 07:54 AM    PLATELET 166 07/26/2015 07:54 AM    MCV 91 07/26/2015 07:54 AM       Lab Results   Component Value Date/Time    Glucose 92 07/26/2015 07:54 AM    LDL, calculated 144 07/26/2015 07:54 AM    Creatinine 1.23 07/26/2015 07:54 AM      Lab Results   Component Value Date/Time    Cholesterol, total 225 07/26/2015 07:54 AM    HDL Cholesterol 64 07/26/2015 07:54 AM    LDL, calculated 144 07/26/2015 07:54 AM    Triglyceride 87 07/26/2015 07:54 AM       Lab Results   Component Value Date/Time    ALT 23 07/26/2015 07:54 AM    AST 23 07/26/2015 07:54 AM     Alk. phosphatase 35 07/26/2015 07:54 AM    Bilirubin, total 0.5 07/26/2015 07:54 AM       Lab Results   Component Value Date/Time    GFR est AA 73 07/26/2015 07:54 AM    GFR est non-AA 63 07/26/2015 07:54 AM    Creatinine 1.23 07/26/2015 07:54 AM    BUN 20 07/26/2015 07:54 AM    Sodium 142 07/26/2015 07:54 AM    Potassium 4.8 07/26/2015 07:54 AM    Chloride 102 07/26/2015 07:54 AM    CO2 25 07/26/2015 07:54 AM      Lab Results   Component Value Date/Time    Prostate Specific Ag 2.9 03/06/2015 10:48 AM    Prostate Specific Ag 2.6 04/09/2014 10:05 AM    Prostate Specific Ag 2.5 04/12/2013 08:10 AM     Lab Results   Component Value Date/Time    TSH 1.960 03/06/2015 10:48 AM      Lab Results   Component Value Date/Time    Glucose 92 07/26/2015 07:54 AM  Health Maintenance   Topic Date Due   ??? Hepatitis C Screening  04-13-1956   ??? COLONOSCOPY  09/19/1972   ??? DTaP/Tdap/Td series (2 - Td) 08/24/2016   ??? ZOSTER VACCINE AGE 61>  Addressed   ??? INFLUENZA AGE 69 TO ADULT  Addressed       Past Medical History   Diagnosis Date   ??? Atopic dermatitis    ??? Hypertension    ??? Insomnia        Past Surgical History   Procedure Laterality Date   ??? Hx hernia repair  1979   ??? Hx colonoscopy  2006     benign       Current Outpatient Prescriptions   Medication Sig   ??? losartan (COZAAR) 50 mg tablet Take 1 Tab by mouth two (2) times a day.   ??? lovastatin (MEVACOR) 20 mg tablet Take 1 Tab by mouth nightly.   ??? zolpidem (AMBIEN) 10 mg tablet Take 1 Tab by mouth nightly as needed for Sleep.   ??? amoxicillin-clavulanate (AUGMENTIN) 875-125 mg per tablet Take 1 Tab by mouth two (2) times a day.   ??? mometasone (ELOCON) 0.1 % topical cream Apply  to affected area two (2) times a day. Indications: ATOPIC DERMATITIS   ??? b complex vitamins tablet Take 1 tablet by mouth daily.   ??? ascorbic acid (VITAMIN C) 500 mg tablet Take 1,000 mg by mouth daily.   ??? cholecalciferol, vitamin D3, (VITAMIN D3) 2,000 unit tab Take  by mouth.    ??? Glucosam-Chon-Collag-Hyalur Ac 375-300-50-2 mg cap Take  by mouth daily.   ??? ferrous sulfate (IRON) 325 mg (65 mg iron) tablet Take  by mouth Daily (before breakfast).   ??? OTHER Apple Pectin takes 1 tablet once daily   ??? coenzyme q10-vitamin e (COQ10 SG 100) 100-100 mg-unit cap Take  by mouth.     No current facility-administered medications for this visit.        Social History     Social History   ??? Marital status: MARRIED     Spouse name: N/A   ??? Number of children: N/A   ??? Years of education: N/A     Social History Main Topics   ??? Smoking status: Never Smoker   ??? Smokeless tobacco: Never Used   ??? Alcohol use Yes      Comment: socially   ??? Drug use: No   ??? Sexual activity: Yes     Partners: Female     Other Topics Concern   ??? None     Social History Narrative       Family History   Problem Relation Age of Onset   ??? Cancer Mother      breast and Leukemia   ??? Heart Disease Father      CAD   ??? Hypertension Father    ??? Cancer Father      Prostate       No Known Allergies    Results for orders placed or performed in visit on 07/26/15   LIPID PANEL   Result Value Ref Range    Cholesterol, total 225 (H) 100 - 199 mg/dL    Triglyceride 87 0 - 149 mg/dL    HDL Cholesterol 64 >39 mg/dL    VLDL, calculated 17 5 - 40 mg/dL    LDL, calculated 144 (H) 0 - 99 mg/dL   METABOLIC PANEL, COMPREHENSIVE   Result Value Ref Range    Glucose 92 65 -  99 mg/dL    BUN 20 8 - 27 mg/dL    Creatinine 1.23 0.76 - 1.27 mg/dL    GFR est non-AA 63 >59 mL/min/1.73    GFR est AA 73 >59 mL/min/1.73    BUN/Creatinine ratio 16 10 - 22    Sodium 142 134 - 144 mmol/L    Potassium 4.8 3.5 - 5.2 mmol/L    Chloride 102 96 - 106 mmol/L    CO2 25 18 - 29 mmol/L    Calcium 9.4 8.6 - 10.2 mg/dL    Protein, total 6.5 6.0 - 8.5 g/dL    Albumin 4.4 3.6 - 4.8 g/dL    GLOBULIN, TOTAL 2.1 1.5 - 4.5 g/dL    A-G Ratio 2.1 1.1 - 2.5    Bilirubin, total 0.5 0.0 - 1.2 mg/dL    Alk. phosphatase 35 (L) 39 - 117 IU/L    AST 23 0 - 40 IU/L    ALT 23 0 - 44 IU/L    CBC WITH AUTOMATED DIFF   Result Value Ref Range    WBC 5.0 3.4 - 10.8 x10E3/uL    RBC 4.46 4.14 - 5.80 x10E6/uL    HGB 13.3 12.6 - 17.7 g/dL    HCT 40.6 37.5 - 51.0 %    MCV 91 79 - 97 fL    MCH 29.8 26.6 - 33.0 pg    MCHC 32.8 31.5 - 35.7 g/dL    RDW 14.0 12.3 - 15.4 %    PLATELET 166 150 - 379 x10E3/uL    NEUTROPHILS 52 %    Lymphocytes 34 %    MONOCYTES 12 %    EOSINOPHILS 2 %    BASOPHILS 0 %    ABS. NEUTROPHILS 2.6 1.4 - 7.0 x10E3/uL    Abs Lymphocytes 1.7 0.7 - 3.1 x10E3/uL    ABS. MONOCYTES 0.6 0.1 - 0.9 x10E3/uL    ABS. EOSINOPHILS 0.1 0.0 - 0.4 x10E3/uL    ABS. BASOPHILS 0.0 0.0 - 0.2 x10E3/uL    IMMATURE GRANULOCYTES 0 %    ABS. IMM. GRANS. 0.0 0.0 - 0.1 x10E3/uL   URINALYSIS W/ RFLX MICROSCOPIC   Result Value Ref Range    Specific Gravity 1.018 1.005 - 1.030    pH (UA) 6.0 5.0 - 7.5    Color Yellow Yellow    Appearance Clear Clear    Leukocyte Esterase Negative Negative    Protein Negative Negative/Trace    Glucose Negative Negative    Ketone Negative Negative    Blood Negative Negative    Bilirubin Negative Negative    Urobilinogen 0.2 0.2 - 1.0 mg/dL    Nitrites Negative Negative    Microscopic Examination Comment    CVD REPORT   Result Value Ref Range    INTERPRETATION Note        Review of Systems   Constitutional: Negative.    HENT: Negative.  Negative for congestion, facial swelling, hearing loss, mouth sores, nosebleeds, sore throat, tinnitus and trouble swallowing.    Eyes: Negative.    Respiratory: Negative.    Cardiovascular: Negative.    Gastrointestinal: Negative.  Negative for blood in stool, constipation, diarrhea and nausea.   Genitourinary: Negative.    Musculoskeletal: Negative.  Negative for neck stiffness.   Skin: Negative.    Neurological: Negative.    Psychiatric/Behavioral: Negative.        Visit Vitals   ??? BP 134/70 (BP 1 Location: Left arm, BP Patient Position: Sitting)   ??? Pulse 64   ???  Temp 98.1 ??F (36.7 ??C) (Oral)   ??? Ht 5' 7.5" (1.715 m)   ??? Wt 165 lb (74.8 kg)    ??? BMI 25.46 kg/m2       Physical Exam   Constitutional: He is oriented to person, place, and time. He appears well-developed and well-nourished.   HENT:   Head: Normocephalic and atraumatic.   Eyes: Pupils are equal, round, and reactive to light. No scleral icterus.   Neck: Neck supple.   Cardiovascular: Normal rate, regular rhythm and normal heart sounds.    No murmur heard.  Pulmonary/Chest: Effort normal and breath sounds normal.   Musculoskeletal: He exhibits no edema.   Neurological: He is alert and oriented to person, place, and time.   Skin: Skin is warm and dry. No pallor.   Psychiatric: He has a normal mood and affect. His behavior is normal.   Nursing note and vitals reviewed.      Assessment and Plan    1. Routine check-up  EKG is normal sinus rhythm.  Paperwork for travel overseas is completed.    I recommended he pursue a screening colonoscopy in the next year.  - REFERRAL FOR COLONOSCOPY  - EKG, 12 LEAD, INITIAL    2. Hyperlipidemia, unspecified hyperlipidemia type  The patient is asked to make an attempt to improve diet and exercise patterns to aid in medical management of this problem.  Lab Results   Component Value Date/Time    LDL, calculated 144 07/26/2015 07:54 AM       - lovastatin (MEVACOR) 20 mg tablet; Take 1 Tab by mouth nightly.  Dispense: 90 Tab; Refill: 3    3. Sleep disorder, circadian, shift work type  Medication refilled  Stable on current management.  No change in medications    - zolpidem (AMBIEN) 10 mg tablet; Take 1 Tab by mouth nightly as needed for Sleep.  Dispense: 90 Tab; Refill: 3    4. Essential hypertension  Stable on current management.  No change in medications    - losartan (COZAAR) 50 mg tablet; Take 1 Tab by mouth two (2) times a day.  Dispense: 180 Tab; Refill: 3    5. Travel advice encounter  He is given a prescription for Augmentin to have on hand for travel overseas.  - amoxicillin-clavulanate (AUGMENTIN) 875-125 mg per tablet; Take 1 Tab by  mouth two (2) times a day.  Dispense: 14 Tab; Refill: 0      ICD-10-CM ICD-9-CM    1. Routine check-up Z00.00 V70.0 REFERRAL FOR COLONOSCOPY      EKG, 12 LEAD, INITIAL      METABOLIC PANEL, COMPREHENSIVE      LIPID PANEL      CBC WITH AUTOMATED DIFF      PSA DIAGNOSTIC (PROSTATIC SPECIFIC AG)      TSH 3RD GENERATION      URINALYSIS W/ RFLX MICROSCOPIC   2. Hyperlipidemia, unspecified hyperlipidemia type E78.5 272.4 lovastatin (MEVACOR) 20 mg tablet      DISCONTINUED: lovastatin (MEVACOR) 20 mg tablet   3. 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 Essential hypertension I10 401.9 losartan (COZAAR) 50 mg tablet      DISCONTINUED: losartan (COZAAR) 50 mg tablet   5. Travel advice encounter Z71.89 V65.49 amoxicillin-clavulanate (AUGMENTIN) 875-125 mg per tablet      DISCONTINUED: amoxicillin-clavulanate (AUGMENTIN) 875-125 mg per tablet  Willie Stewart was seen today for follow-up.    Diagnoses and all orders for this visit:    Routine check-up  -     REFERRAL FOR COLONOSCOPY  -     EKG, 12 LEAD, INITIAL  -     METABOLIC PANEL, COMPREHENSIVE; Future  -     LIPID PANEL; Future  -     CBC WITH AUTOMATED DIFF; Future  -     PROSTATE SPECIFIC AG; Future  -     TSH 3RD GENERATION; Future  -     URINALYSIS W/ RFLX MICROSCOPIC; Future    Hyperlipidemia, unspecified hyperlipidemia type  -     Discontinue: lovastatin (MEVACOR) 20 mg tablet; Take 1 Tab by mouth nightly.  -     lovastatin (MEVACOR) 20 mg tablet; Take 1 Tab by mouth nightly.    Sleep disorder, circadian, shift work type  -     Discontinue: zolpidem (AMBIEN) 10 mg tablet; Take 1 Tab by mouth nightly as needed for Sleep.  -     zolpidem (AMBIEN) 10 mg tablet; Take 1 Tab by mouth nightly as needed for Sleep.    Essential hypertension  -     Discontinue: losartan (COZAAR) 50 mg tablet; Take 1 Tab by mouth two (2) times a day.   -     losartan (COZAAR) 50 mg tablet; Take 1 Tab by mouth two (2) times a day.    Travel advice encounter  -     Discontinue: amoxicillin-clavulanate (AUGMENTIN) 875-125 mg per tablet; Take 1 Tab by mouth two (2) times a day.  -     amoxicillin-clavulanate (AUGMENTIN) 875-125 mg per tablet; Take 1 Tab by mouth two (2) times a day.      Follow-up Disposition:  Return in about 1 year (around 08/11/2016).

## 2016-11-19 ENCOUNTER — Encounter

## 2016-11-23 ENCOUNTER — Encounter: Admit: 2016-11-23 | Discharge: 2016-11-23 | Payer: PRIVATE HEALTH INSURANCE | Primary: Internal Medicine

## 2016-11-23 DIAGNOSIS — I10 Essential (primary) hypertension: Secondary | ICD-10-CM

## 2016-11-24 LAB — METABOLIC PANEL, COMPREHENSIVE
A-G Ratio: 2 (ref 1.2–2.2)
ALT (SGPT): 22 IU/L (ref 0–44)
AST (SGOT): 27 IU/L (ref 0–40)
Albumin: 4.6 g/dL (ref 3.6–4.8)
Alk. phosphatase: 47 IU/L (ref 39–117)
BUN/Creatinine ratio: 21 (ref 10–24)
BUN: 24 mg/dL (ref 8–27)
Bilirubin, total: 0.7 mg/dL (ref 0.0–1.2)
CO2: 22 mmol/L (ref 18–29)
Calcium: 9.4 mg/dL (ref 8.6–10.2)
Chloride: 100 mmol/L (ref 96–106)
Creatinine: 1.13 mg/dL (ref 0.76–1.27)
GFR est AA: 80 mL/min/{1.73_m2} (ref >59)
GFR est non-AA: 69 mL/min/{1.73_m2} (ref >59)
GLOBULIN, TOTAL: 2.3 g/dL (ref 1.5–4.5)
Glucose: 91 mg/dL (ref 65–99)
Potassium: 4.7 mmol/L (ref 3.5–5.2)
Protein, total: 6.9 g/dL (ref 6.0–8.5)
Sodium: 139 mmol/L (ref 134–144)

## 2016-11-24 LAB — CBC WITH AUTOMATED DIFF
ABS. BASOPHILS: 0 10*3/uL (ref 0.0–0.2)
ABS. EOSINOPHILS: 0 10*3/uL (ref 0.0–0.4)
ABS. IMM. GRANS.: 0 10*3/uL (ref 0.0–0.1)
ABS. MONOCYTES: 0.9 10*3/uL (ref 0.1–0.9)
ABS. NEUTROPHILS: 3.1 10*3/uL (ref 1.4–7.0)
Abs Lymphocytes: 2.2 10*3/uL (ref 0.7–3.1)
BASOPHILS: 0 %
EOSINOPHILS: 1 %
HCT: 43 % (ref 37.5–51.0)
HGB: 14.3 g/dL (ref 13.0–17.7)
IMMATURE GRANULOCYTES: 0 %
Lymphocytes: 35 %
MCH: 29.9 pg (ref 26.6–33.0)
MCHC: 33.3 g/dL (ref 31.5–35.7)
MCV: 90 fL (ref 79–97)
MONOCYTES: 14 %
NEUTROPHILS: 50 %
PLATELET: 186 10*3/uL (ref 150–379)
RBC: 4.79 x10E6/uL (ref 4.14–5.80)
RDW: 14 % (ref 12.3–15.4)
WBC: 6.2 10*3/uL (ref 3.4–10.8)

## 2016-11-24 LAB — URINALYSIS W/ RFLX MICROSCOPIC
Bilirubin: NEGATIVE
Blood: NEGATIVE
Glucose: NEGATIVE
Ketone: NEGATIVE
Leukocyte Esterase: NEGATIVE
Nitrites: NEGATIVE
Protein: NEGATIVE
Specific Gravity: 1.005 (ref 1.005–1.030)
Urobilinogen: 0.2 mg/dL (ref 0.2–1.0)
pH (UA): 6.5 (ref 5.0–7.5)

## 2016-11-24 LAB — LIPID PANEL
Cholesterol, total: 291 mg/dL — ABNORMAL HIGH (ref 100–199)
HDL Cholesterol: 69 mg/dL (ref >39)
LDL, calculated: 203 mg/dL — ABNORMAL HIGH (ref 0–99)
Triglyceride: 97 mg/dL (ref 0–149)
VLDL, calculated: 19 mg/dL (ref 5–40)

## 2016-11-24 LAB — CVD REPORT

## 2016-11-26 ENCOUNTER — Ambulatory Visit
Admit: 2016-11-26 | Discharge: 2016-11-26 | Payer: PRIVATE HEALTH INSURANCE | Attending: Internal Medicine | Primary: Internal Medicine

## 2016-11-26 DIAGNOSIS — Z0000 Encounter for general adult medical examination without abnormal findings: Secondary | ICD-10-CM

## 2016-11-26 MED ORDER — MOMETASONE 0.1 % TOPICAL CREAM
0.1 % | Freq: Two times a day (BID) | CUTANEOUS | 0 refills | Status: DC
Start: 2016-11-26 — End: 2017-09-10

## 2016-11-26 MED ORDER — AMLODIPINE-BENAZEPRIL 5 MG-40 MG CAP
5-40 mg | ORAL_CAPSULE | Freq: Every day | ORAL | 3 refills | Status: DC
Start: 2016-11-26 — End: 2016-11-26

## 2016-11-26 MED ORDER — ROSUVASTATIN 5 MG TAB
5 mg | ORAL_TABLET | Freq: Every evening | ORAL | 3 refills | Status: DC
Start: 2016-11-26 — End: 2016-11-26

## 2016-11-26 MED ORDER — AMLODIPINE-BENAZEPRIL 5 MG-40 MG CAP
5-40 mg | ORAL_CAPSULE | Freq: Every day | ORAL | 3 refills | Status: DC
Start: 2016-11-26 — End: 2017-02-28

## 2016-11-26 MED ORDER — ZOLPIDEM 10 MG TAB
10 mg | ORAL_TABLET | Freq: Every evening | ORAL | 3 refills | Status: DC | PRN
Start: 2016-11-26 — End: 2017-06-10

## 2016-11-26 MED ORDER — ROSUVASTATIN 5 MG TAB
5 mg | ORAL_TABLET | Freq: Every evening | ORAL | 3 refills | Status: DC
Start: 2016-11-26 — End: 2017-02-28

## 2016-11-26 NOTE — Progress Notes (Signed)
Chief Complaint   Patient presents with   ??? Eye Problem     right eye-"intermittent film/shadow comes across eye",present for about a year   ??? Shoulder Pain     right shoulder pain   ??? New Order     referral for Colonoscopy       The patient is a 61 y.o. male who is seen for routine checkup.      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      He is tolerating his blood pressure medications and his blood pressure is well controlled.  He exercises on a regular basis and is a non-smoker.  No chest pain shortness of breath or abdominal pain.    He has occasional insomnia with travel that is alleviated by Ambien.  Retired now and not traveling as much.      Cholesterol is generally pretty well controlled.  Ran out of statin.  Has taken Lovastatin for years.  Some increased arthralgias on this medication he believes.  Open to trying alternative.   Recent stress echo in May of 2017 while living in [place].  Notes reviewed.  Negative exam.    Chronic Rt shoulder pain.  Pain with sleeping on shoulder and bothersome with lifting at times.  He previously has had a right long head of the biceps tear.  This was a traumatic injury years ago.  More bothersome and since he is retired he would like to have this looked at.  He has had periodic steroid injections over the years prior to establishing care.    He is noticed some right eye floaters at times.  They come and go but when in their field of vision it is bothersome to him.  No recent head trauma, no loss of vision, no eye pain.  No shading.  No acute hearing loss or visual disturbances that are significant.    Medication refilled          Patient Active Problem List   Diagnosis Code   ??? HTN (hypertension) I10   ??? Hyperlipemia E78.5   ??? 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 CAD (coronary artery disease) I25.10   ??? Hemangioma of liver D18.03      Lab Results   Component Value Date/Time    WBC 6.2 11/23/2016 09:55 AM     HGB 14.3 11/23/2016 09:55 AM    HCT 43.0 11/23/2016 09:55 AM    PLATELET 186 11/23/2016 09:55 AM    MCV 90 11/23/2016 09:55 AM       Lab Results   Component Value Date/Time    Glucose 91 11/23/2016 09:55 AM    LDL, calculated 203 (H) 11/23/2016 09:55 AM    Creatinine 1.13 11/23/2016 09:55 AM      Lab Results   Component Value Date/Time    Cholesterol, total 291 (H) 11/23/2016 09:55 AM    HDL Cholesterol 69 11/23/2016 09:55 AM    LDL, calculated 203 (H) 11/23/2016 09:55 AM    Triglyceride 97 11/23/2016 09:55 AM       Lab Results   Component Value Date/Time    ALT (SGPT) 22 11/23/2016 09:55 AM    AST (SGOT) 27 11/23/2016 09:55 AM    Alk. phosphatase 47 11/23/2016 09:55 AM    Bilirubin, total 0.7 11/23/2016 09:55 AM       Lab Results   Component Value Date/Time    GFR est AA 80 11/23/2016 09:55 AM    GFR est  non-AA 69 11/23/2016 09:55 AM    Creatinine 1.13 11/23/2016 09:55 AM    BUN 24 11/23/2016 09:55 AM    Sodium 139 11/23/2016 09:55 AM    Potassium 4.7 11/23/2016 09:55 AM    Chloride 100 11/23/2016 09:55 AM    CO2 22 11/23/2016 09:55 AM      Lab Results   Component Value Date/Time    Prostate Specific Ag 2.9 03/06/2015 10:48 AM    Prostate Specific Ag 2.6 04/09/2014 10:05 AM    Prostate Specific Ag 2.5 04/12/2013 08:10 AM     Lab Results   Component Value Date/Time    TSH 1.960 03/06/2015 10:48 AM      Lab Results   Component Value Date/Time    Glucose 91 11/23/2016 09:55 AM         Health Maintenance   Topic Date Due   ??? Hepatitis C Screening  [birth year]   ??? COLONOSCOPY  09/19/1972   ??? DTaP/Tdap/Td series (2 - Td) 08/24/2016   ??? Influenza Age 59 to Adult  02/24/2017   ??? ZOSTER VACCINE AGE 30>  Addressed       Past Medical History:   Diagnosis Date   ??? Atopic dermatitis    ??? Hypertension    ??? Insomnia        Past Surgical History:   Procedure Laterality Date   ??? HX COLONOSCOPY  2006    benign   ??? HX HERNIA REPAIR  1979       Current Outpatient Prescriptions   Medication Sig    ??? mometasone (ELOCON) 0.1 % topical cream Apply  to affected area two (2) times a day. Indications: Atopic Dermatitis   ??? rosuvastatin (CRESTOR) 5 mg tablet Take 1 Tab by mouth nightly. Indications: hyperlipidemia, primary prevention of coronary heart disease   ??? amLODIPine-benazepril (LOTREL) 5-40 mg per capsule Take 1 Cap by mouth daily.   ??? zolpidem (AMBIEN) 10 mg tablet Take 1 Tab by mouth nightly as needed for Sleep.   ??? b complex vitamins tablet Take 1 tablet by mouth daily.   ??? ascorbic acid (VITAMIN C) 500 mg tablet Take 1,000 mg by mouth daily.   ??? cholecalciferol, vitamin D3, (VITAMIN D3) 2,000 unit tab Take  by mouth.   ??? Glucosam-Chon-Collag-Hyalur Ac 375-300-50-2 mg cap Take  by mouth daily.   ??? coenzyme q10-vitamin e (COQ10 SG 100) 100-100 mg-unit cap Take  by mouth.   ??? amoxicillin-clavulanate (AUGMENTIN) 875-125 mg per tablet Take 1 Tab by mouth two (2) times a day.     No current facility-administered medications for this visit.        Social History     Social History   ??? Marital status: MARRIED     Spouse name: N/A   ??? Number of children: N/A   ??? Years of education: N/A     Social History Main Topics   ??? Smoking status: Never Smoker   ??? Smokeless tobacco: Never Used   ??? Alcohol use Yes      Comment: socially   ??? Drug use: No   ??? Sexual activity: Yes     Partners: Female     Other Topics Concern   ??? None     Social History Narrative       Family History   Problem Relation Age of Onset   ??? Cancer Mother      breast and Leukemia   ??? Heart Disease Father      CAD   ???  Hypertension Father    ??? Cancer Father      Prostate       No Known Allergies    Results for orders placed or performed in visit on 11/23/16   CBC WITH AUTOMATED DIFF   Result Value Ref Range    WBC 6.2 3.4 - 10.8 x10E3/uL    RBC 4.79 4.14 - 5.80 x10E6/uL    HGB 14.3 13.0 - 17.7 g/dL    HCT 43.0 37.5 - 51.0 %    MCV 90 79 - 97 fL    MCH 29.9 26.6 - 33.0 pg    MCHC 33.3 31.5 - 35.7 g/dL    RDW 14.0 12.3 - 15.4 %     PLATELET 186 150 - 379 x10E3/uL    NEUTROPHILS 50 Not Estab. %    Lymphocytes 35 Not Estab. %    MONOCYTES 14 Not Estab. %    EOSINOPHILS 1 Not Estab. %    BASOPHILS 0 Not Estab. %    ABS. NEUTROPHILS 3.1 1.4 - 7.0 x10E3/uL    Abs Lymphocytes 2.2 0.7 - 3.1 x10E3/uL    ABS. MONOCYTES 0.9 0.1 - 0.9 x10E3/uL    ABS. EOSINOPHILS 0.0 0.0 - 0.4 x10E3/uL    ABS. BASOPHILS 0.0 0.0 - 0.2 x10E3/uL    IMMATURE GRANULOCYTES 0 Not Estab. %    ABS. IMM. GRANS. 0.0 0.0 - 0.1 N62X5/MW   METABOLIC PANEL, COMPREHENSIVE   Result Value Ref Range    Glucose 91 65 - 99 mg/dL    BUN 24 8 - 27 mg/dL    Creatinine 1.13 0.76 - 1.27 mg/dL    GFR est non-AA 69 >59 mL/min/1.73    GFR est AA 80 >59 mL/min/1.73    BUN/Creatinine ratio 21 10 - 24    Sodium 139 134 - 144 mmol/L    Potassium 4.7 3.5 - 5.2 mmol/L    Chloride 100 96 - 106 mmol/L    CO2 22 18 - 29 mmol/L    Calcium 9.4 8.6 - 10.2 mg/dL    Protein, total 6.9 6.0 - 8.5 g/dL    Albumin 4.6 3.6 - 4.8 g/dL    GLOBULIN, TOTAL 2.3 1.5 - 4.5 g/dL    A-G Ratio 2.0 1.2 - 2.2    Bilirubin, total 0.7 0.0 - 1.2 mg/dL    Alk. phosphatase 47 39 - 117 IU/L    AST (SGOT) 27 0 - 40 IU/L    ALT (SGPT) 22 0 - 44 IU/L   URINALYSIS W/ RFLX MICROSCOPIC   Result Value Ref Range    Specific Gravity 1.005 1.005 - 1.030    pH (UA) 6.5 5.0 - 7.5    Color Yellow Yellow    Appearance Clear Clear    Leukocyte Esterase Negative Negative    Protein Negative Negative/Trace    Glucose Negative Negative    Ketone Negative Negative    Blood Negative Negative    Bilirubin Negative Negative    Urobilinogen 0.2 0.2 - 1.0 mg/dL    Nitrites Negative Negative    Microscopic Examination Comment    LIPID PANEL   Result Value Ref Range    Cholesterol, total 291 (H) 100 - 199 mg/dL    Triglyceride 97 0 - 149 mg/dL    HDL Cholesterol 69 >39 mg/dL    VLDL, calculated 19 5 - 40 mg/dL    LDL, calculated 203 (H) 0 - 99 mg/dL    Comment Comment    CVD REPORT  Result Value Ref Range    INTERPRETATION Note        Review of Systems    Constitutional: Negative.    HENT: Negative.  Negative for congestion, facial swelling, hearing loss, mouth sores, nosebleeds, sore throat, tinnitus and trouble swallowing.    Eyes: Positive for visual disturbance.   Respiratory: Negative.    Cardiovascular: Negative.    Gastrointestinal: Negative.  Negative for blood in stool, constipation, diarrhea and nausea.   Genitourinary: Negative.    Musculoskeletal: Positive for arthralgias. Negative for neck stiffness.   Skin: Negative.    Neurological: Negative.    Psychiatric/Behavioral: Negative.  Negative for sleep disturbance.       Visit Vitals   ??? BP 134/82 (BP 1 Location: Left arm, BP Patient Position: Sitting)   ??? Pulse 61   ??? Temp 97.4 ??F (36.3 ??C) (Oral)   ??? Ht 5' 7.5" (1.715 m)   ??? Wt 167 lb (75.8 kg)   ??? SpO2 96%   ??? BMI 25.77 kg/m2       Physical Exam   Constitutional: He is oriented to person, place, and time. He appears well-developed and well-nourished.   HENT:   Head: Normocephalic and atraumatic.   Eyes: Pupils are equal, round, and reactive to light. No scleral icterus.   Neck: Neck supple.   Cardiovascular: Normal rate, regular rhythm and normal heart sounds.    No murmur heard.  Pulmonary/Chest: Effort normal and breath sounds normal.   Abdominal: Hernia confirmed negative in the right inguinal area and confirmed negative in the left inguinal area.   Genitourinary: Testes normal and penis normal. Rectal exam shows external hemorrhoid. Rectal exam shows no mass and no tenderness. Prostate is not enlarged and not tender.   Musculoskeletal: He exhibits no edema.   Lymphadenopathy: No inguinal adenopathy noted on the right or left side.   Neurological: He is alert and oriented to person, place, and time.   Skin: Skin is warm and dry. No pallor.   Psychiatric: He has a normal mood and affect. His behavior is normal.   Nursing note and vitals reviewed.      Assessment and Plan     1. Routine check-up       2. Pure hypercholesterolemia   The patient is asked to make an attempt to improve diet and exercise patterns to aid in medical management of this problem.    Start Crestor 5 mg daily. Re check labs in six months.    A healthy diet to consider is a more mediterranean diet which is abundant in fruits, vegetables, whole grains, legumes and olive oil. It features fish and poultry, lean sources of protein over red meat.  The importance of a healthy lifestyle are discussed.  Limit high fructose containing foods, moderate portion sizes,  regular cardiovascular exercise (walking, swimming, aerobics) for 30-45 minutes, 3-4 times weekly.      - LIPID PANEL; Future  - ALT; Future  - AST; Future  - rosuvastatin (CRESTOR) 5 mg tablet; Take 1 Tab by mouth nightly. Indications: hyperlipidemia, primary prevention of coronary heart disease  Dispense: 90 Tab; Refill: 3    3. Colon cancer screening  I think would be fine for direct referral.      The patient was counseled on the risks and benefits of colonoscopy. Major risks include bleeding (07/998), perforation (07/998), missed lesions, and reactions to sedation medications or bowel prep. We discussed  Various alternatives which could include flexible sigmoidoscopy, contrast enema, CT colonography,  and FOBT, and fecal DNA testing. I explained the risks and benefits of each of these and why I feel colonoscopy is the best test for the patient.     - REFERRAL FOR COLONOSCOPY    4. Chronic right shoulder pain  Chronic right shoulder pain.  Prior biceps tear.  Ongoing.  Previous steroid injections.  Would like ortho referral.    - REFERRAL TO ORTHOPEDIC SURGERY    5. Vitreous floaters of right eye  Rt eye floater.  Comes and goes.  No shading.  No vision loss.  No recent head trauma or eye pain.    - OPHTHALMOLOGY    6. Essential hypertension  Stable on current management.  No change in medications    - amLODIPine-benazepril (LOTREL) 5-40 mg per capsule; Take 1 Cap by mouth daily.  Dispense: 90 Cap; Refill: 3     7. Sleep disorder, circadian, shift work type  Stable on current management.  No change in medications  Only prn use.  Rarely takes.  Medication refilled to have on hand.     - zolpidem (AMBIEN) 10 mg tablet; Take 1 Tab by mouth nightly as needed for Sleep.  Dispense: 90 Tab; Refill: 3        ICD-10-CM ICD-9-CM    1. Routine check-up Z00.00 V70.0    2. Pure hypercholesterolemia E78.00 272.0 LIPID PANEL      ALT      AST      rosuvastatin (CRESTOR) 5 mg tablet      DISCONTINUED: rosuvastatin (CRESTOR) 5 mg tablet   3. Colon cancer screening Z12.11 V76.51 REFERRAL FOR COLONOSCOPY   4. Chronic right shoulder pain M25.511 719.41 REFERRAL TO ORTHOPEDIC SURGERY    G89.29 338.29    5. Vitreous floaters of right eye H43.391 379.24 REFERRAL TO OPHTHALMOLOGY   6. Essential hypertension I10 401.9 amLODIPine-benazepril (LOTREL) 5-40 mg per capsule      DISCONTINUED: amLODIPine-benazepril (LOTREL) 5-40 mg per capsule   7. 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 Routine check-up    2. Pure hypercholesterolemia  -     LIPID PANEL; Future  -     ALT; Future  -     AST; Future  -     rosuvastatin (CRESTOR) 5 mg tablet; Take 1 Tab by mouth nightly. Indications: hyperlipidemia, primary prevention of coronary heart disease    3. Colon cancer screening  -     REFERRAL FOR COLONOSCOPY    4. Chronic right shoulder pain  -     REFERRAL TO ORTHOPEDIC SURGERY    5. Vitreous floaters of right eye  -     OPHTHALMOLOGY    6. Essential hypertension  -     amLODIPine-benazepril (LOTREL) 5-40 mg per capsule; Take 1 Cap by mouth daily.    7. Sleep disorder, circadian, shift work type  -     zolpidem (AMBIEN) 10 mg tablet; Take 1 Tab by mouth nightly as needed for Sleep.    Other orders  -     mometasone (ELOCON) 0.1 % topical cream; Apply  to affected area two (2) times a day. Indications: Atopic Dermatitis      Follow-up Disposition:   Return in about 6 months (around 05/29/2017).

## 2016-12-07 ENCOUNTER — Inpatient Hospital Stay: Primary: Internal Medicine

## 2017-01-01 ENCOUNTER — Encounter: Primary: Internal Medicine

## 2017-01-22 NOTE — Other (Signed)
Received call back from pt, reports he is out of the country ([place]) and will not be reachable via phone until Monday, July 2.

## 2017-01-23 ENCOUNTER — Inpatient Hospital Stay: Primary: Internal Medicine

## 2017-01-25 ENCOUNTER — Inpatient Hospital Stay: Admit: 2017-01-25 | Payer: PRIVATE HEALTH INSURANCE | Primary: Internal Medicine

## 2017-01-25 DIAGNOSIS — M25511 Pain in right shoulder: Secondary | ICD-10-CM

## 2017-01-25 NOTE — Other (Signed)
Willie Stewart  DOB: September 02, 1955  Payor: Onnie Boer / Plan: [place] / Product Type: Commerical /    [facility]  [address], [address], [place]  Phone:[phone]   [phone]         OUTPATIENT PHYSICAL THERAPY:Initial Assessment and Daily Note 01/25/2017    ICD-10: Treatment Diagnosis: Pain in right shoulder (M25.511)  Precautions/Allergies:   Review of patient's allergies indicates no known allergies.   Fall Risk Score: 1 (? 5 = High Risk)  MD Orders: PT eval and tx MEDICAL/REFERRING DIAGNOSIS:  shoulder, right Pain in right shoulder (M25.511)  DATE OF ONSET: 2002  REFERRING PHYSICIAN: Batson, Alveta Heimlich., MD  RETURN PHYSICIAN APPOINTMENT: not scheduled      INITIAL ASSESSMENT:  Willie Stewart presents to PT eval w/ c/o R shoulder pain mostly w/ overhead movements. He displayed decreased ROM and strength in his R shoulder. He is going to be in an out of the country due to his job, so pt requested a comprehensive exercise HEP, which he was issued this session. He is scheduled for 1 more session prior to leaving the country again and 1 session when he returns. He will benefit from continued skilled PT services to assess compliance w/ HEP and pain levels.    PROBLEM LIST (Impacting functional limitations):  1. Decreased Strength  2. Decreased ADL/Functional Activities  3. Increased Pain  4. Decreased Activity Tolerance  5. Decreased Flexibility/Joint Mobility  6. Decreased Independence with Home Exercise Program INTERVENTIONS PLANNED:  1. Cold  2. Electrical Stimulation  3. Heat  4. Home Exercise Program (HEP)  5. Manual Therapy  6. Range of Motion (ROM)  7. Therapeutic Exercise/Strengthening   TREATMENT PLAN:  Effective Dates: 01/25/2017 TO 03/26/2017 (60 days). Frequency/Duration: 1 time a week prior to leaving the country, once he returns he will be seen 1 more time a week to assess   GOALS: (Goals have been discussed and agreed upon with patient.)  Discharge Goals: Time Frame: 2 weeks  1. Pt will be independent w/ HEP in order to improve outcomes and decrease pain levels.   2. Pt will have R shoulder flexion ROM increase of 5* w/ pain 2/10 or less in order to improve functional mobility.   3. Pt will havwe QUICKDASH score of 19 or less in order to display decreased pain and decreased functional impairments.   4. Pt will have R shoulder strength of 4+ or greater in order to increase his functional abilities and return to PLOF.     Rehabilitation Potential For Stated Goals: Good  Regarding [name]'s therapy, I certify that the treatment plan above will be carried out by a therapist or under their direction.  Thank you for this referral,  [name], PT     Referring Physician Signature: [name]., MD              Date                    The information in this section was collected on 01/25/17 (except where otherwise noted).  HISTORY:   History of Present Injury/Illness (Reason for Referral):  Pt hurt his shoulder in 2002. He ruptured his biceps tendon in 2010 while [place] seas and was unaware of the rupture until he got a second opinion (first opinion was a strain). 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 He notes  his shoulder has been bothering him when he lifts weights.   Past Medical History/Comorbidities:   Willie Stewart  has a past medical history of Atopic dermatitis; Hypertension; and Insomnia.  Willie Stewart  has a past surgical history that includes hx hernia repair (1979) and hx colonoscopy (2006).  Social History/Living Environment:     Lives w/ family in 2 story home.   Prior Level of Function/Work/Activity:  Independent, lifts weights and performs cardio when in the states.   Dominant Side:         RIGHT  Current Medications:       Current Outpatient Prescriptions:    ???  mometasone (ELOCON) 0.1 % topical cream, Apply  to affected area two (2) times a day. Indications: Atopic Dermatitis, Disp: 45 g, Rfl: 0  ???  rosuvastatin (CRESTOR) 5 mg tablet, Take 1 Tab by mouth nightly. Indications: hyperlipidemia, primary prevention of coronary heart disease, Disp: 90 Tab, Rfl: 3  ???  amLODIPine-benazepril (LOTREL) 5-40 mg per capsule, Take 1 Cap by mouth daily., Disp: 90 Cap, Rfl: 3  ???  zolpidem (AMBIEN) 10 mg tablet, Take 1 Tab by mouth nightly as needed for Sleep., Disp: 90 Tab, Rfl: 3  ???  amoxicillin-clavulanate (AUGMENTIN) 875-125 mg per tablet, Take 1 Tab by mouth two (2) times a day., Disp: 14 Tab, Rfl: 0  ???  b complex vitamins tablet, Take 1 tablet by mouth daily., Disp: , Rfl:   ???  ascorbic acid (VITAMIN C) 500 mg tablet, Take 1,000 mg by mouth daily., Disp: , Rfl:   ???  cholecalciferol, vitamin D3, (VITAMIN D3) 2,000 unit tab, Take  by mouth., Disp: , Rfl:   ???  Glucosam-Chon-Collag-Hyalur Ac 375-300-50-2 mg cap, Take  by mouth daily., Disp: , Rfl:   ???  coenzyme q10-vitamin e (COQ10 SG 100) 100-100 mg-unit cap, Take  by mouth., Disp: , Rfl:    Date Last Reviewed:  01/25/2017    Number of Personal Factors/Comorbidities that affect the Plan of Care: 1-2: MODERATE COMPLEXITY   EXAMINATION:   Observation/Orthostatic Postural Assessment:          Rounded shoulders and forward head  Palpation:          No tenderness noted on R shoulder musculature   Special Tests:          (+) on cross body and HK, (-) on [place]  Sensation:        Intact w/ light touch    Joint/Muscle ROM Strength Updates   Shoulder flexion L 180*, R 160*  5/5 L, 4/5 R    Shoulder abduction L 180*, R 180* 5/5 L, 4/5 R    Shoulder ER L 90*, R 80* 5/5 L, 5/5 R    Shoulder IR L 70*, R 50* 5/5 L, 4/5 R    Shoulder extension L 60*, R 50* 5/5 L, 4/5 R    Biceps flexion WFL 5/5 L, 4/5 R    Triceps extension WFL 5/5 B sides    Upper trap - 4-/5 B    Lower treap - 4-/5 B    Middle trap - 4-/5 B         Body Structures Involved:   1. Bones  2. Joints  3. Muscles  4. Ligaments Body Functions Affected:  1. Sensory/Pain  2. Neuromusculoskeletal  3. Movement Related Activities and Participation Affected:  1. General Tasks and Demands  2. Mobility  3. Self Care  4. Domestic Life  5. Interpersonal Interactions and Relationships  6. Community, Social and [employer]   Number of elements (examined above) that affect the Plan of Care: 3: MODERATE COMPLEXITY   CLINICAL PRESENTATION:   Presentation: Stable and uncomplicated: LOW COMPLEXITY   CLINICAL DECISION MAKING:   Outcome Measure:   Tool Used: Disabilities of the Arm, Shoulder and Hand (DASH) Questionnaire - Quick Version  Score:  Initial: 22/55  Most Recent: X/55 (Date: -- )   Interpretation of Score: The DASH is designed to measure the activities of daily living in person's with upper extremity dysfunction or pain.  Each section is scored on a 1-5 scale, 5 representing the greatest disability.  The scores of each section are added together for a total score of 55.    Score 11 12-19 20-28 29-37 38-45 46-54 55   Modifier CH CI CJ CK CL CM CN     ? Carrying, Moving, and Handling Objects:    [phone] - CURRENT STATUS: CJ - 20%-39% impaired, limited or restricted   O1308 - GOAL STATUS: CI - 1%-19% impaired, limited or restricted   M5784 - D/C STATUS:  ---------------To be determined---------------      Medical Necessity:   ?? Patient is expected to demonstrate progress in strength, range of motion, balance and coordination to increase independence with functional tasks.  Reason for Services/Other Comments:  ?? Patient continues to demonstrate capacity to improve strength, ROM, mobility which will increase independence.   Use of outcome tool(s) and clinical judgement create a POC that gives a: Clear prediction of patient's progress: LOW COMPLEXITY            TREATMENT:   (In addition to Assessment/Re-Assessment sessions the following treatments were rendered)  Pre-treatment Symptoms/Complaints:  See above.    Pain: Initial:     discomfort 2/10  (mild pain) Post Session:  Increased soreness      THERAPEUTIC EXERCISE: (25 minutes):  Exercises per grid below to improve mobility, strength, balance and coordination.  Required minimal verbal and tactile cues to promote proper body alignment, promote proper body posture and promote proper body mechanics.  Progressed resistance, range, repetitions and complexity of movement as indicated.   Date:  01/25/17 Date:   Date:     Activity/Exercise Parameters Parameters Parameters   Shoulder ER 15x YTB     Shoulder IR 15x YTB     Shoulder flexion 15x YTB     Shoulder extension 15x YTB     Shoulder abduction 15x YTB     Chest press 15x YTB     Prone Y 15x     Prone T 15x     Prone M 15x     Shoulder flexion serratus band 15x YTB     Shoulder row 15x YTB                 Treatment/Session Assessment:    ?? Response to Treatment: Pt performed therex w/ minimal cues. He had some increased soreness following exercises.   ?? Compliance with Program/Exercises: Will assess as treatment progresses.  ?? Recommendations/Intent for next treatment session: "Next visit will focus on advancements to more challenging activities".    MedBridge Portal  Access Code: [email or web address]   URL: [email or web address]   Date: 01/25/2017   Prepared by: [name]     Exercises   Standing Shoulder External Rotation with Resistance - 10 reps - 3 sets - 3 hold - 1x daily - 3x weekly   Shoulder Internal Rotation with Resistance - 10 reps - 3 sets -  3 hold - 1x daily - 3x weekly   Standing Shoulder Flexion with Resistance - 10 reps - 3 sets - 3 hold - 1x daily - 3x weekly   Standing Shoulder Extension with Resistance - 10 reps - 3 sets - 3 hold - 1x daily - 3x weekly   Standing Shoulder Abduction with Resistance - 10 reps - 3 sets - 3 hold - 1x daily - 3x weekly   Supine Chest Press with Resistance - 10 reps - 3 sets - 3 hold - 1x daily - 3x weekly    Prone Single Arm Shoulder Y - 10 reps - 3 sets - 3 hold - 1x daily - 3x weekly   Prone Shoulder Horizontal Abduction - 10 reps - 3 sets - 3 hold - 1x daily - 3x weekly   Prone Shoulder Extension - 10 reps - 3 sets - 3 hold - 1x daily - 3x weekly   Shoulder Flexion Serratus Activation with Resistance - 10 reps - 3 sets - 3 hold - 1x daily - 3x weekly   Seated Shoulder Row with Resistance Anchored at Feet - 10 reps - 3 sets - 3 hold - 1x daily - 3x weekly     Variance from POC: none  PT Patient Time In/Time Out  Time In: 0815  Time Out: 4580  Treatment time: [place], PT

## 2017-01-25 NOTE — Progress Notes (Signed)
Ambulatory/Rehab Services H2 Model Falls Risk Assessment    Risk Factor Pts. ??   Confusion/Disorientation/Impulsivity  []    4 ??   Symptomatic Depression  []   2 ??   Altered Elimination  []   1 ??   Dizziness/Vertigo  []   1 ??   Gender (Male)  [x]   1 ??   Any administered antiepileptics (anticonvulsants):  []   2 ??   Any administered benzodiazepines:  []   1 ??   Visual Impairment (specify):  []   1 ??   Portable Oxygen Use  []   1 ??   Orthostatic ? BP  []   1 ??   History of Recent Falls (within 3 mos.)  []   5     Ability to Rise from Chair (choose one) Pts. ??   Ability to rise in a single movement  []   0 ??   Pushes up, successful in one attempt  []   1 ??   Multiple attempts, but successful  []   3 ??   Unable to rise without assistance  []   4   Total: (5 or greater = High Risk) 1     Falls Prevention Plan:   []                Physical Limitations to Exercise (specify):   []                Mobility Assistance Device (type):   []                Exercise/Equipment Adaptation (specify):    ??2010 [employer] All Rights Reserved. [place] Patent #7,282,031. Federal Law prohibits the replication, distribution or use without written permission from [employer]

## 2017-01-25 NOTE — Other (Signed)
Patient verified name, DOB, and procedure.    Type: 1a; abbreviated assessment per anesthesia guidelines  Labs per surgeon: None.   Labs per anesthesia: None.     Instructed pt that they will be notified via office/GI LAB for time of arrival to GI lab.     Follow diet and prep instructions per office. NPO after midnight.    Bath or shower the night before and the am of surgery with antibacterial soap. No lotions, oils, powders, cologne on skin. No make up, eye make up or jewelry. Wear loose fitting comfortable, clean clothing.     Must have adult present in building the entire time .     Medications for the day of procedure None, patient to hold all vitamins/supplements/herbals and Advil.     The following discharge instructions reviewed with patient: medication given during procedure may cause drowsiness for several hours, therefore, do not drive or operate machinery for remainder of the day. You may not drink alcohol on the day of your procedure, please resume regular diet and activity unless otherwise directed. You may experience abdominal distention for several hours that is relieved by the passage of gas. Contact your physician if you have any of the following: fever or chills, severe abdominal pain or excessive amount of bleeding or a large amount when having a bowel movement. Occasional specks of blood with bowel movement would not be unusual.

## 2017-01-29 ENCOUNTER — Inpatient Hospital Stay: Payer: PRIVATE HEALTH INSURANCE

## 2017-01-29 MED ORDER — LIDOCAINE (PF) 20 MG/ML (2 %) IV SYRINGE
100 mg/5 mL (2 %) | INTRAVENOUS | Status: AC
Start: 2017-01-29 — End: ?

## 2017-01-29 MED ORDER — LACTATED RINGERS IV
INTRAVENOUS | Status: DC
Start: 2017-01-29 — End: 2017-01-29
  Administered 2017-01-29: 13:00:00 via INTRAVENOUS

## 2017-01-29 MED ORDER — PROPOFOL 10 MG/ML IV EMUL
10 mg/mL | INTRAVENOUS | Status: DC | PRN
Start: 2017-01-29 — End: 2017-01-29
  Administered 2017-01-29 (×2): via INTRAVENOUS

## 2017-01-29 MED ORDER — PROPOFOL 10 MG/ML IV EMUL
10 mg/mL | INTRAVENOUS | Status: AC
Start: 2017-01-29 — End: ?

## 2017-01-29 MED ORDER — PROPOFOL 10 MG/ML IV EMUL
10 mg/mL | INTRAVENOUS | Status: DC | PRN
Start: 2017-01-29 — End: 2017-01-29
  Administered 2017-01-29: 13:00:00 via INTRAVENOUS

## 2017-01-29 MED FILL — PROPOFOL 10 MG/ML IV EMUL: 10 mg/mL | INTRAVENOUS | Qty: 60

## 2017-01-29 MED FILL — LACTATED RINGERS IV: INTRAVENOUS | Qty: 1000

## 2017-01-29 MED FILL — LIDOCAINE (PF) 20 MG/ML (2 %) IV SYRINGE: 100 mg/5 mL (2 %) | INTRAVENOUS | Qty: 5

## 2017-01-29 NOTE — Op Note (Signed)
Othello Dickenson A. Bennie Hind., MD  [facility]  [address] [place]  [address]  [phone]        Colonoscopy Procedure Note    Willie Stewart  [birth year]  [id number]    Indications:    Screening colonoscopy     Pre-operative Diagnosis:   Screening colonoscopy    Post-operative Diagnosis: Normal  Procedure:  Colonoscopy,Terminal Ileoscopy not done, Anoscopy, Digital Rectal Examination.  Surgeon: Ernst Breach, MD    Referring Provider: Rodrigo Ran, MD    Sedation:  MAC anesthesia Propofol    Pre-Procedure Exam:  Airway: clear   Heart: normal S1and S2    Lungs: clear bilateral  Abdomen: soft, nontender, bowel sounds present and normal in all quadrants   Mental Status: awake, alert, and oriented to person, place, and time    Procedure in Detail:  Informed consent was obtained for the procedure after delineating the risks, benefits, and alternatives to the procedure.  Specific risks of perforation (07/998), hemorrhage (07/998), missed lesions, adverse drug reaction, and aspiration were discussed. The patient was placed in the left lateral decubitus position.  Based on the pre-procedure assessment, including review of the patient's medical history, medications, and allergies, moderate sedation was felt to be appropriate.  The aforementioned medications were given in an incremental fashion to achieve adequate sedation.  The patient was monitored continuously with ECG tracing, pulse oximetry, blood pressure monitoring, and direct observations.      A digital rectal examination and anoscopy were performed. The Olympus video colonoscope CFHQ190L was inserted per anus and advanced under direct vision to the cecum, which was identified by the ileocecal valve and appendiceal orifice.  Terminal Ileoscopy was not performed.  The quality of the colonic preparation was good.  The colonoscope was slowly withdrawn using a to-and-fro and circumferential motion  over 8 minutes with careful examination between folds.  Retroflexion was performed in the rectum.  Appropriate photodocumentation was obtained.     Findings:   Rectum: no internal hemorrhoids.  No polyps, masses, bleeding, or mucosal abnormalities.  Sigmoid: no incidental diverticulosis.  No polyps, masses, bleeding, or mucosal abnormalities.  Descending Colon: Normal.  No polyps, masses, bleeding, or mucosal abnormalities.  Transverse Colon: Normal. No polyps, masses, bleeding, or mucosal abnormalities.  Ascending Colon: Normal.  No polyps, masses, bleeding, or mucosal abnormalities.  Cecum: Normal.  No polyps, masses, bleeding, or mucosal abnormalities.  Terminal Ileum: not intubated    Therapies:  none    Implants: None    Specimen:  AS ABOVE    Complications: None; patient tolerated the procedure well.    EBL:  MINIMAL    Recommendations:   -For colon cancer screening in this average-risk patient, colonoscopy may be repeated in 10 years.    -Follow up with Dr Carrington Clamp for interval symptoms.      Ernst Breach, MD  01/29/2017  9:45 AM

## 2017-01-29 NOTE — Op Note (Signed)
Iseah Plouff A. Bennie Hind., MD  [facility]  [address], [place]  [address]  [phone]        Colonoscopy Procedure Note    Willie Stewart  06-06-1956  [id number]    Indications:    Screening colonoscopy     Pre-operative Diagnosis:   Screening colonoscopy    Post-operative Diagnosis: Normal  Procedure:  Colonoscopy,Terminal Ileoscopy not done, Anoscopy, Digital Rectal Examination.  Surgeon: Ernst Breach, MD    Referring Provider: Rodrigo Ran, MD    Sedation:  MAC anesthesia Propofol    Pre-Procedure Exam:  Airway: clear   Heart: normal S1and S2    Lungs: clear bilateral  Abdomen: soft, nontender, bowel sounds present and normal in all quadrants   Mental Status: awake, alert, and oriented to person, place, and time    Procedure in Detail:  Informed consent was obtained for the procedure after delineating the risks, benefits, and alternatives to the procedure.  Specific risks of perforation (07/998), hemorrhage (07/998), missed lesions, adverse drug reaction, and aspiration were discussed. The patient was placed in the left lateral decubitus position.  Based on the pre-procedure assessment, including review of the patient's medical history, medications, and allergies, moderate sedation was felt to be appropriate.  The aforementioned medications were given in an incremental fashion to achieve adequate sedation.  The patient was monitored continuously with ECG tracing, pulse oximetry, blood pressure monitoring, and direct observations.      A digital rectal examination and anoscopy were performed. The Olympus video colonoscope CFHQ190L was inserted per anus and advanced under direct vision to the cecum, which was identified by the ileocecal valve and appendiceal orifice.  Terminal Ileoscopy was not performed.  The quality of the colonic preparation was good.  The colonoscope was slowly withdrawn  using a to-and-fro and circumferential motion over 8 minutes with careful examination between folds.  Retroflexion was performed in the rectum.  Appropriate photodocumentation was obtained.     Findings:   Rectum: no internal hemorrhoids.  No polyps, masses, bleeding, or mucosal abnormalities.  Sigmoid: no incidental diverticulosis.  No polyps, masses, bleeding, or mucosal abnormalities.  Descending Colon: Normal.  No polyps, masses, bleeding, or mucosal abnormalities.  Transverse Colon: Normal. No polyps, masses, bleeding, or mucosal abnormalities.  Ascending Colon: Normal.  No polyps, masses, bleeding, or mucosal abnormalities.  Cecum: Normal.  No polyps, masses, bleeding, or mucosal abnormalities.  Terminal Ileum: not intubated    Therapies:  none    Implants: None    Specimen:  AS ABOVE    Complications: None; patient tolerated the procedure well.    EBL:  MINIMAL    Recommendations:   -For colon cancer screening in this average-risk patient, colonoscopy may be repeated in 10 years.    -Follow up with Dr Carrington Clamp for interval symptoms.      Ernst Breach, MD  01/29/2017  9:45 AM

## 2017-01-29 NOTE — H&P (Signed)
Antonie Borjon A. Bennie Hind., MD  [facility]  [address] [place]  [address]  [phone]    Laderius Valbuena  MRN: [id number]    PCP: Rodrigo Ran, MD    HISTORY OF PRESENT ILLNESS:  Willie Stewart is a 61 y.o. year old male sent to me in consultation from Rodrigo Ran, MD  to discuss colorectal cancer screening and colonoscopy.  The patient has had a colonoscopy before.  No family history of colon/rectal cancer.  No family history of colon/rectal polyps.  There is no history of HNPCC associated cancers.      There have been no changes in bowel habits; no problems with diarrhea, constipation, or change in stool size.  No bleeding, no hematochezia, no melena.  No weight loss, fevers, chills, or other constitutional symptoms.  No abdominal pain/cramping.  No nausea/vomiting.  No perianal itching/burning, protrusion/swelling, or rectal discharge.  No incontinence of gas/liquid/solid.  Has normal bowel movements.  Stool consistency is soft.  No straining for BMs. Dietary fiber only.  No fiber supplements.  No stool softeners, laxatives, or enemas. No pain with BM.      REVIEW OF SYSTEMS:  Constitutional: No fevers/chills, no weakness, no fatigue, no lightheadedness, no night sweats, no insomnia, no appetite changes, no weight changes, no memory problems.   Eyes:    Ears/Nose/Throat/Mouth:     Respiratory: No cough, no dyspnea, no wheezing, no hemoptysis, no sleep apnea   Cardiovascular: No chest pains, no chest pressure, no chest tightness, no palpitations   Gastrointestinal: No abdominal pains, no incontinence, no jaundice, no diverticulosis. Otherwise per HPI   Genitourinary: No dysuria, no hematuria, no urinary frequency, no nocturia, no recent UTIs   Musculoskeletal: No back/neck pain, no arthritis, no joint pain/swelling, no muscle pains   Skin:    Hematologic:  No easy bruising, no anemia   Neurologic:     Psychiatric: No depressive symptoms, no anxiety symptoms, no changes in mood, no substance abuse     PAST MEDICAL HISTORY:    Past Medical History:   Diagnosis Date   ??? Atopic dermatitis    ??? Hypercholesteremia    ??? Hypertension     Controlled with medication    ??? Insomnia        PAST SURGICAL HISTORY:    Past Surgical History:   Procedure Laterality Date   ??? HX COLONOSCOPY  2006    benign   ??? HX HERNIA REPAIR  1979       ALLERGIES: No Known Allergies    HOME MEDICATIONS:   Current Facility-Administered Medications   Medication Dose Route Frequency   ??? lactated Ringers infusion  100 mL/hr IntraVENous CONTINUOUS       SOCIAL HISTORY:   Social History     Social History   ??? Marital status: MARRIED     Spouse name: N/A   ??? Number of children: N/A   ??? Years of education: N/A     Occupational History   ??? Not on file.     Social History Main Topics   ??? Smoking status: Never Smoker   ??? Smokeless tobacco: Never Used   ??? Alcohol use Yes      Comment: socially   ??? Drug use: No   ??? Sexual activity: Yes     Partners: Female     Other Topics Concern   ??? Not on file     Social History Narrative  Family History   Problem Relation Age of Onset   ??? Cancer Mother      breast and Leukemia   ??? Heart Disease Father      CAD   ??? Hypertension Father    ??? Cancer Father      Prostate       PHYSICAL EXAM:  Blood pressure 141/84, pulse (!) 59, temperature 97.9 ??F (36.6 ??C), resp. rate 16, height 5\' 6"  (1.676 m), weight 163 lb 6.4 oz (74.1 kg), SpO2 98 %.  Body mass index is 26.37 kg/(m^2).  All male patients are examined in the presence of my Nurse or a medical assistant and at the request of male patients.  General: Normotensive, no acute distress, well developed, well nourished appearing   Head:  AT/NC, no lesions   Eyes:  PERRLA, EOM's full, conjunctivae clear   Neck:  Supple, no masses, no lymphadenopathy, no thyromegaly, no carotid bruits   Chest:  Lungs clear, no rales, no rhonchi, no wheezes   Heart:  RR, no rubs, no gallops    Abdomen:  Soft, no tenderness, no rebound, no guarding, no masses, nondistended.     Rectal:  deferred   Back:  Normal curvature, no tenderness.     Extremities:  FROM, no deformities, no edema, no erythema   Neuro:  Physiologic, oriented x3, full affect, no localizing findings   Skin:             ASSESSMENT:  Age related colon cancer risk/screening   He has had one previous colonoscopy in [place] in 2005.  He states that it was normal and without polyps.  As well as risk factors related to family history and current symptoms.  Were discussed.  PLAN:  --The patient was counseled on the risks and benefits of colonoscopy.  Major risks include bleeding (07/998), perforation (07/998), missed lesions, and reactions to sedation medications or bowel prep.  I explained the risks and benefits of colonoscopy.    - All questions were answered to the patient's satisfaction.  The colonoscopy will be scheduled at the patients next convenient time.    The patient will follow up with me to discuss any pathology.  If it is benign the patient is given the option to receive a phone call instead of the visit.    Fax communication sent to Rodrigo Ran, MD      Greater than 50% of this 30 minute visit was spent on counseling, discussing the rationale behind colon cancer screening, the colonoscopy procedure, risks, benefits, and alternatives.      Amaani Guilbault A. Bennie Hind., MD  01/29/2017

## 2017-01-29 NOTE — Anesthesia Post-Procedure Evaluation (Signed)
Post-Anesthesia Evaluation and Assessment    Patient: Willie Stewart MRN: [id number]  SSN: [id number]    Date of Birth: [birth year]  Age: 61 y.o.  Sex: male       Cardiovascular Function/Vital Signs  Visit Vitals   ??? BP 121/80 (BP 1 Location: Left arm, BP Patient Position: At rest)   ??? Pulse 60   ??? Temp 36.4 ??C (97.6 ??F)   ??? Resp 16   ??? Ht 5\' 6"  (1.676 m)   ??? Wt 74.1 kg (163 lb 6.4 oz)   ??? SpO2 98%   ??? BMI 26.37 kg/m2       Patient is status post total IV anesthesia anesthesia for Procedure(s):  COLONOSCOPY/ 26.    Nausea/Vomiting: None    Postoperative hydration reviewed and adequate.    Pain:  Pain Scale 1: Visual (01/29/17 0950)  Pain Intensity 1: 0 (01/29/17 0950)   Managed    Neurological Status:       At baseline    Mental Status and Level of Consciousness: Arousable    Pulmonary Status:   O2 Device: Nasal cannula (01/29/17 0950)   Adequate oxygenation and airway patent    Complications related to anesthesia: None    Post-anesthesia assessment completed. No concerns    Signed By: Filomena Jungling., MD     January 29, 2017

## 2017-01-29 NOTE — Anesthesia Pre-Procedure Evaluation (Signed)
Anesthetic History   No history of anesthetic complications            Review of Systems / Medical History  Patient summary reviewed, nursing notes reviewed and pertinent labs reviewed    Pulmonary  Within defined limits        Undiagnosed apnea         Neuro/Psych   Within defined limits           Cardiovascular    Hypertension: well controlled          Hyperlipidemia    Exercise tolerance: >4 METS     GI/Hepatic/Renal  Within defined limits              Endo/Other  Within defined limits           Other Findings              Physical Exam    Airway  Mallampati: II  TM Distance: 4 - 6 cm  Neck ROM: normal range of motion   Mouth opening: Normal     Cardiovascular    Rhythm: regular           Dental  No notable dental hx       Pulmonary  Breath sounds clear to auscultation               Abdominal         Other Findings            Anesthetic Plan    ASA: 2  Anesthesia type: total IV anesthesia          Induction: Intravenous  Anesthetic plan and risks discussed with: Patient

## 2017-01-30 MED FILL — PROPOFOL 10 MG/ML IV EMUL: 10 mg/mL | INTRAVENOUS | Qty: 327.48

## 2017-02-01 ENCOUNTER — Inpatient Hospital Stay: Admit: 2017-02-01 | Payer: PRIVATE HEALTH INSURANCE | Primary: Internal Medicine

## 2017-02-01 NOTE — Progress Notes (Signed)
Willie Stewart  DOB: 1956-06-10  Payor: Onnie Boer / Plan: [place] / Product Type: Commerical /    [facility]  [address], [place]  Phone:[phone]   [phone]         OUTPATIENT PHYSICAL THERAPY:Daily Note 02/01/2017    ICD-10: Treatment Diagnosis: Pain in right shoulder (M25.511)  Precautions/Allergies:   Review of patient's allergies indicates no known allergies.   Fall Risk Score: 1 (? 5 = High Risk)  MD Orders: PT eval and tx MEDICAL/REFERRING DIAGNOSIS:  shoulder, right Pain in right shoulder (M25.511)  DATE OF ONSET: 2002  REFERRING PHYSICIAN: Batson, Alveta Heimlich., MD  RETURN PHYSICIAN APPOINTMENT: not scheduled    ASSESSMENT:  Willie Stewart presents to PT eval w/ c/o R shoulder pain mostly w/ overhead movements. He displayed decreased ROM and strength in his R shoulder. He is going to be in an out of the country due to his job, so pt requested a comprehensive exercise HEP, which he was issued this session. He is scheduled for 1 more session prior to leaving the country again and 1 session when he returns. He will benefit from continued skilled PT services to assess compliance w/ HEP and pain levels.    PROBLEM LIST (Impacting functional limitations):  1. Decreased Strength  2. Decreased ADL/Functional Activities  3. Increased Pain  4. Decreased Activity Tolerance  5. Decreased Flexibility/Joint Mobility  6. Decreased Independence with Home Exercise Program INTERVENTIONS PLANNED:  1. Cold  2. Electrical Stimulation  3. Heat  4. Home Exercise Program (HEP)  5. Manual Therapy  6. Range of Motion (ROM)  7. Therapeutic Exercise/Strengthening   TREATMENT PLAN:  Effective Dates: 01/25/2017 TO 03/26/2017 (60 days). Frequency/Duration: 1 time a week prior to leaving the country, once he returns he will be seen 1 more time a week to assess  GOALS: (Goals have been discussed and agreed upon with patient.)   Discharge Goals: Time Frame: 2 weeks  1. Pt will be independent w/ HEP in order to improve outcomes and decrease pain levels.   2. Pt will have R shoulder flexion ROM increase of 5* w/ pain 2/10 or less in order to improve functional mobility.   3. Pt will havwe QUICKDASH score of 19 or less in order to display decreased pain and decreased functional impairments.   4. Pt will have R shoulder strength of 4+ or greater in order to increase his functional abilities and return to PLOF.               The information in this section was collected on 01/25/17 (except where otherwise noted).  HISTORY:   History of Present Injury/Illness (Reason for Referral):  Pt hurt his shoulder in 2002. He ruptured his biceps tendon in 2010 while [place] seas and was unaware of the rupture until he got a second opinion (first opinion was a strain). 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 He notes his shoulder has been bothering him when he lifts weights.   Past Medical History/Comorbidities:   Willie Stewart  has a past medical history of Atopic dermatitis; Hypercholesteremia; Hypertension; and Insomnia.  Willie Stewart  has a past surgical history that includes hx hernia repair (1979) and hx colonoscopy (2006).  Social History/Living Environment:     Lives w/ family in 2 story home.   Prior Level of Function/Work/Activity:  Independent, lifts weights and performs cardio when  in the states.   Dominant Side:         RIGHT  Current Medications:       Current Outpatient Prescriptions:   ???  acetaminophen (TYLENOL) 325 mg tablet, Take  by mouth every four (4) hours as needed for Pain., Disp: , Rfl:   ???  ibuprofen (ADVIL) 200 mg tablet, Take  by mouth., Disp: , Rfl:   ???  mometasone (ELOCON) 0.1 % topical cream, Apply  to affected area two (2) times a day. Indications: Atopic Dermatitis, Disp: 45 g, Rfl: 0  ???  rosuvastatin (CRESTOR) 5 mg tablet, Take 1 Tab by mouth nightly.  Indications: hyperlipidemia, primary prevention of coronary heart disease, Disp: 90 Tab, Rfl: 3  ???  amLODIPine-benazepril (LOTREL) 5-40 mg per capsule, Take 1 Cap by mouth daily. (Patient taking differently: Take 1 Cap by mouth nightly.), Disp: 90 Cap, Rfl: 3  ???  zolpidem (AMBIEN) 10 mg tablet, Take 1 Tab by mouth nightly as needed for Sleep., Disp: 90 Tab, Rfl: 3  ???  amoxicillin-clavulanate (AUGMENTIN) 875-125 mg per tablet, Take 1 Tab by mouth two (2) times a day., Disp: 14 Tab, Rfl: 0  ???  b complex vitamins tablet, Take 1 tablet by mouth daily., Disp: , Rfl:   ???  ascorbic acid (VITAMIN C) 500 mg tablet, Take 1,000 mg by mouth daily., Disp: , Rfl:   ???  cholecalciferol, vitamin D3, (VITAMIN D3) 2,000 unit tab, Take  by mouth., Disp: , Rfl:   ???  Glucosam-Chon-Collag-Hyalur Ac 375-300-50-2 mg cap, Take  by mouth daily., Disp: , Rfl:   ???  coenzyme q10-vitamin e (COQ10 SG 100) 100-100 mg-unit cap, Take  by mouth., Disp: , Rfl:    Date Last Reviewed:  02/01/2017    EXAMINATION:   Observation/Orthostatic Postural Assessment:          Rounded shoulders and forward head  Palpation:          No tenderness noted on R shoulder musculature   Special Tests:          (+) on cross body and HK, (-) on [place]  Sensation:        Intact w/ light touch    Joint/Muscle ROM Strength Updates   Shoulder flexion L 180*, R 160*  5/5 L, 4/5 R    Shoulder abduction L 180*, R 180* 5/5 L, 4/5 R    Shoulder ER L 90*, R 80* 5/5 L, 5/5 R    Shoulder IR L 70*, R 50* 5/5 L, 4/5 R    Shoulder extension L 60*, R 50* 5/5 L, 4/5 R    Biceps flexion WFL 5/5 L, 4/5 R    Triceps extension WFL 5/5 B sides    Upper trap - 4-/5 B    Lower treap - 4-/5 B    Middle trap - 4-/5 B         Body Structures Involved:  1. Bones  2. Joints  3. Muscles  4. Ligaments Body Functions Affected:  1. Sensory/Pain  2. Neuromusculoskeletal  3. Movement Related Activities and Participation Affected:  1. General Tasks and Demands  2. Mobility  3. Self Care  4. Domestic Life   5. Interpersonal Interactions and Relationships  6. Community, Social and Civic Life   CLINICAL PRESENTATION:   CLINICAL DECISION MAKING:   Outcome Measure:   Tool Used: Disabilities of the Arm, Shoulder and Hand (DASH) Questionnaire - Quick Version  Score:  Initial: 22/55  Most Recent: X/55 (  Date: -- )   Interpretation of Score: The DASH is designed to measure the activities of daily living in person's with upper extremity dysfunction or pain.  Each section is scored on a 1-5 scale, 5 representing the greatest disability.  The scores of each section are added together for a total score of 55.    Score 11 12-19 20-28 29-37 38-45 46-54 55   Modifier CH CI CJ CK CL CM CN     ? Carrying, Moving, and Handling Objects:    [phone] - CURRENT STATUS: CJ - 20%-39% impaired, limited or restricted   [id number] - GOAL STATUS: CI - 1%-19% impaired, limited or restricted   [id number] - D/C STATUS:  ---------------To be determined---------------      Medical Necessity:   ?? Patient is expected to demonstrate progress in strength, range of motion, balance and coordination to increase independence with functional tasks.  Reason for Services/Other Comments:  ?? Patient continues to demonstrate capacity to improve strength, ROM, mobility which will increase independence.            TREATMENT:   (In addition to Assessment/Re-Assessment sessions the following treatments were rendered)  Pre-treatment Symptoms/Complaints: Pt reports his shoulder feels better already w/ just the exercises. Pt wanted PT to print out his old PT note back exercises.   Pain: Initial:     discomfort 1/10  (mild pain) Post Session: feels fine      THERAPEUTIC EXERCISE: (45 minutes):  Exercises per grid below to improve mobility, strength, balance and coordination.  Required minimal verbal and tactile cues to promote proper body alignment, promote proper body posture and promote proper body mechanics.  Progressed resistance, range,  repetitions and complexity of movement as indicated.   Date:  01/25/17 Date:  02/01/17 Date:     Activity/Exercise Parameters Parameters Parameters   Shoulder ER 15x YTB 20x YTB    Shoulder IR 15x YTB 20x RTB    Shoulder flexion 15x YTB 20x RTB    Shoulder extension 15x YTB 20x RTB    Shoulder abduction 15x YTB 15x YTB    Chest press 15x YTB 20x GTB    Prone Y 15x 15x2    Prone T 15x 15x2 2#    Prone M 15x 15x2 2#    Shoulder flexion serratus band 15x YTB 20x YTB    Shoulder row 15x YTB 20x GTB    UBE  5/5 1.0    Thoracic stretch  1 min on foam    Pec stretch  1 min on foam        Treatment/Session Assessment:    ?? Response to Treatment: Pt progressed w/ therex and required min A w/ his exercises.   ?? Compliance with Program/Exercises: Will assess as treatment progresses.  ?? Recommendations/Intent for next treatment session: "Next visit will focus on advancements to more challenging activities".    MedBridge Portal  Access Code: [email or web address]   URL: [email or web address]   Date: 02/01/2017   Prepared by: [name]     Exercises   Standing Shoulder External Rotation with Resistance - 10 reps - 3 sets - 3 hold - 1x daily - 3x weekly   Shoulder Internal Rotation with Resistance - 10 reps - 3 sets - 3 hold - 1x daily - 3x weekly   Standing Shoulder Flexion with Resistance - 10 reps - 3 sets - 3 hold - 1x daily - 3x weekly   Standing Shoulder Extension with Resistance - 10 reps - 3 sets -  3 hold - 1x daily - 3x weekly   Standing Shoulder Abduction with Resistance - 10 reps - 3 sets - 3 hold - 1x daily - 3x weekly   Supine Chest Press with Resistance - 10 reps - 3 sets - 3 hold - 1x daily - 3x weekly   Prone Single Arm Shoulder Y - 10 reps - 3 sets - 3 hold - 1x daily - 3x weekly   Prone Shoulder Horizontal Abduction - 10 reps - 3 sets - 3 hold - 1x daily - 3x weekly   Prone Shoulder Extension - 10 reps - 3 sets - 3 hold - 1x daily - 3x weekly    Shoulder Flexion Serratus Activation with Resistance - 10 reps - 3 sets - 3 hold - 1x daily - 3x weekly   Seated Shoulder Row with Resistance Anchored at Feet - 10 reps - 3 sets - 3 hold - 1x daily - 3x weekly   Prone Press Up - 10 reps - 3 sets - 5 hold - 1x daily - 3x weekly   Supine Lower Trunk Rotation - 10 reps - 3 sets - 5 hold - 1x daily - 3x weekly   Supine Bridge - 10 reps - 3 sets - 5 hold - 1x daily - 3x weekly   Quadruped Alternating Leg Extensions - 10 reps - 3 sets - 5 hold - 1x daily - 3x weekly   Bird Dog - 10 reps - 3 sets - 5 hold - 1x daily - 3x weekly   Anti-Rotation Sidestepping with Resistance - 10 reps - 3 sets - 5 hold - 1x daily - 3x weekly     Variance from POC: none  PT Patient Time In/Time Out  Time In: 0730  Time Out: 0815  Treatment time: [place], PT

## 2017-02-28 ENCOUNTER — Encounter

## 2017-03-01 ENCOUNTER — Inpatient Hospital Stay: Admit: 2017-03-01 | Payer: PRIVATE HEALTH INSURANCE | Primary: Internal Medicine

## 2017-03-01 DIAGNOSIS — M25511 Pain in right shoulder: Secondary | ICD-10-CM

## 2017-03-01 MED ORDER — ROSUVASTATIN 5 MG TAB
5 mg | ORAL_TABLET | Freq: Every evening | ORAL | 3 refills | Status: DC
Start: 2017-03-01 — End: 2017-06-10

## 2017-03-01 MED ORDER — AMLODIPINE-BENAZEPRIL 5 MG-40 MG CAP
5-40 mg | ORAL_CAPSULE | Freq: Every day | ORAL | 3 refills | Status: DC
Start: 2017-03-01 — End: 2017-06-10

## 2017-03-01 NOTE — Telephone Encounter (Signed)
From: Magdalene Patricia  To: Rodrigo Ran, MD  Sent: 02/28/2017 1:15 PM EDT  Subject:  Medication Renewal Request    Original  authorizing provider: Rodrigo Ran, MD    Magdalene Patricia would like a refill of the following medications:  rosuvastatin  (CRESTOR) 5 mg tablet Rodrigo Ran, MD]  amLODIPine-benazepril  (LOTREL) 5-40 mg per capsule Rodrigo Ran, MD]    Preferred  pharmacy: [facility]    Comment:  Please  release prescription to [facility]. You should have a request from them for approval. Thanks

## 2017-03-01 NOTE — Progress Notes (Signed)
Willie Stewart  DOB: February 10, 1956  Payor: Onnie Boer / Plan: [employer] / Product Type: Commerical /    [facility]  [address], [place]  [phone]   [phone]         OUTPATIENT PHYSICAL THERAPY:Daily Note and Discharge 03/01/2017    ICD-10: Treatment Diagnosis: Pain in right shoulder (M25.511)  Precautions/Allergies:   Review of patient's allergies indicates no known allergies.   Fall Risk Score: 1 (? 5 = High Risk)  MD Orders: PT eval and tx MEDICAL/REFERRING DIAGNOSIS:  shoulder, right Pain in right shoulder (M25.511)  DATE OF ONSET: 2002  REFERRING PHYSICIAN: Batson, Alveta Heimlich., MD  RETURN PHYSICIAN APPOINTMENT: not scheduled    ASSESSMENT:  Mr. Sagan presented to PT eval w/ c/o R shoulder pain mostly w/ overhead movements. He now has [facility] strength and ROM on his R side w/ minimal pain. He is independent w/ his HEP and has met 4/4 PT goals. DC from outpatient PT at this time.   TREATMENT PLAN:  GOALS: (Goals have been discussed and agreed upon with patient.)  Discharge Goals: Time Frame: 2 weeks  1. Pt will be independent w/ HEP in order to improve outcomes and decrease pain levels. MET  2. Pt will have R shoulder flexion ROM increase of 5* w/ pain 2/10 or less in order to improve functional mobility. MET  3. Pt will havwe QUICKDASH score of 19 or less in order to display decreased pain and decreased functional impairments.  MET  4. Pt will have R shoulder strength of 4+ or greater in order to increase his functional abilities and return to PLOF. MET              The information in this section was collected on 01/25/17 (except where otherwise noted).  HISTORY:   History of Present Injury/Illness (Reason for Referral):  Pt hurt his shoulder in 2002. He ruptured his biceps tendon in [place] while [place] seas and was unaware of the rupture until he got a second opinion  (first opinion was a strain). 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 He notes his shoulder has been bothering him when he lifts weights.   Past Medical History/Comorbidities:   Mr. Eisenhardt  has a past medical history of Atopic dermatitis; Hypercholesteremia; Hypertension; and Insomnia.  Mr. Beg  has a past surgical history that includes hx hernia repair (1979); hx colonoscopy (2006); and colonoscopy (N/A, 01/29/2017).  Social History/Living Environment:     Lives w/ family in 2 story home.   Prior Level of Function/Work/Activity:  Independent, lifts weights and performs cardio when in the states.   Dominant Side:         RIGHT  Current Medications:       Current Outpatient Prescriptions:   ???  acetaminophen (TYLENOL) 325 mg tablet, Take  by mouth every four (4) hours as needed for Pain., Disp: , Rfl:   ???  ibuprofen (ADVIL) 200 mg tablet, Take  by mouth., Disp: , Rfl:   ???  mometasone (ELOCON) 0.1 % topical cream, Apply  to affected area two (2) times a day. Indications: Atopic Dermatitis, Disp: 45 g, Rfl: 0  ???  rosuvastatin (CRESTOR) 5 mg tablet, Take 1 Tab by mouth nightly. Indications: hyperlipidemia, primary prevention of coronary heart disease, Disp: 90 Tab, Rfl: 3  ???  amLODIPine-benazepril (LOTREL) 5-40 mg per capsule, Take 1 Cap by mouth  daily. (Patient taking differently: Take 1 Cap by mouth nightly.), Disp: 90 Cap, Rfl: 3  ???  zolpidem (AMBIEN) 10 mg tablet, Take 1 Tab by mouth nightly as needed for Sleep., Disp: 90 Tab, Rfl: 3  ???  amoxicillin-clavulanate (AUGMENTIN) 875-125 mg per tablet, Take 1 Tab by mouth two (2) times a day., Disp: 14 Tab, Rfl: 0  ???  b complex vitamins tablet, Take 1 tablet by mouth daily., Disp: , Rfl:   ???  ascorbic acid (VITAMIN C) 500 mg tablet, Take 1,000 mg by mouth daily., Disp: , Rfl:   ???  cholecalciferol, vitamin D3, (VITAMIN D3) 2,000 unit tab, Take  by mouth., Disp: , Rfl:    ???  Glucosam-Chon-Collag-Hyalur Ac 375-300-50-2 mg cap, Take  by mouth daily., Disp: , Rfl:   ???  coenzyme q10-vitamin e (COQ10 SG 100) 100-100 mg-unit cap, Take  by mouth., Disp: , Rfl:    Date Last Reviewed:  03/01/2017    EXAMINATION:   Observation/Orthostatic Postural Assessment:          Rounded shoulders and forward head  Palpation:          No tenderness noted on R shoulder musculature   Special Tests:          (+) on cross body and HK, (-) on [place]  Sensation:        Intact w/ light touch    Joint/Muscle ROM Strength Updates   Shoulder flexion L 180*, R 160*  5/5 L, 4/5 R 180* R, 5/5 R   Shoulder abduction L 180*, R 180* 5/5 L, 4/5 R 5/5 R   Shoulder ER L 90*, R 80* 5/5 L, 5/5 R 90* R   Shoulder IR L 70*, R 50* 5/5 L, 4/5 R 70* R , 5/5 R   Shoulder extension L 60*, R 50* 5/5 L, 4/5 R 65*, 5/5 R   Biceps flexion WFL 5/5 L, 4/5 R 5/5 R   Triceps extension WFL 5/5 B sides    Upper trap - 4-/5 B 5/5 B   Lower treap - 4-/5 B 5/5 B   Middle trap - 4-/5 B 5/5 B        Body Structures Involved:  1. Bones  2. Joints  3. Muscles  4. Ligaments Body Functions Affected:  1. Sensory/Pain  2. Neuromusculoskeletal  3. Movement Related Activities and Participation Affected:  1. General Tasks and Demands  2. Mobility  3. Self Care  4. Domestic Life  5. Interpersonal Interactions and Relationships  6. Community, Social and Civic Life   CLINICAL PRESENTATION:   CLINICAL DECISION MAKING:   Outcome Measure:   Tool Used: Disabilities of the Arm, Shoulder and Hand (DASH) Questionnaire - Quick Version  Score:  Initial: 22/55  Most Recent: 13/55 (Date: 03/01/17 )   Interpretation of Score: The DASH is designed to measure the activities of daily living in person's with upper extremity dysfunction or pain.  Each section is scored on a 1-5 scale, 5 representing the greatest disability.  The scores of each section are added together for a total score of 55.    Score 11 12-19 20-28 29-37 38-45 46-54 55   Modifier CH CI CJ CK CL CM CN      ? Carrying, Moving, and Handling Objects:    [phone] - CURRENT STATUS: CI - 1%-19% impaired, limited or restricted   O3500 - GOAL STATUS: CI - 1%-19% impaired, limited or restricted   X3818 - D/C STATUS:  CI -  1%-19% impaired, limited or restricted              TREATMENT:   (In addition to Assessment/Re-Assessment sessions the following treatments were rendered)  Pre-treatment Symptoms/Complaints: Pt reports his shoulder feels much better and he hasn't had really any issues.   Pain: Initial:     0/10 Post Session: feels fine      THERAPEUTIC EXERCISE: (40 minutes):  Exercises per grid below to improve mobility, strength, balance and coordination.  Required minimal verbal and tactile cues to promote proper body alignment, promote proper body posture and promote proper body mechanics.  Progressed resistance, range, repetitions and complexity of movement as indicated.  MANUAL THERAPY: (5 minutes): Soft tissue mobilization was utilized and necessary because of the patient's restricted joint motion, painful spasm and restricted motion of soft tissue.  STM to L upper trap and levator   Date:  01/25/17 Date:  02/01/17 Date:  03/01/17   Activity/Exercise Parameters Parameters Parameters   Shoulder ER 15x YTB 20x YTB 20x RTB B   Shoulder IR 15x YTB 20x RTB 20x BTB B   Shoulder flexion 15x YTB 20x RTB 20x BTB   Shoulder extension 15x YTB 20x RTB 20x BTB   Shoulder abduction 15x YTB 15x YTB 20x BTB   Chest press 15x YTB 20x GTB 20x GTB   Prone Y 15x 15x2 20x   Prone T 15x 15x2 2# 20x 2#   Prone M 15x 15x2 2# 20x 2#   Shoulder flexion serratus band 15x YTB 20x YTB 20x YTB   Shoulder row 15x YTB 20x GTB 20x GTB   UBE  5/5 1.0 3/3 1.0   Thoracic stretch  1 min on foam 1 min on foam   Pec stretch  1 min on foam 1 min on foam   Upper trap stretch   10x10 L   Levator stretch   10x10 L       Treatment/Session Assessment:    ?? Response to Treatment: Pt performed therex w/o issue.   ?? Compliance with Program/Exercises: Independent      MedBridge Portal  Access Code: [email or web address]   URL: [email or web address]   Date: 02/01/2017   Prepared by: [name]     Exercises   Standing Shoulder External Rotation with Resistance - 10 reps - 3 sets - 3 hold - 1x daily - 3x weekly   Shoulder Internal Rotation with Resistance - 10 reps - 3 sets - 3 hold - 1x daily - 3x weekly   Standing Shoulder Flexion with Resistance - 10 reps - 3 sets - 3 hold - 1x daily - 3x weekly   Standing Shoulder Extension with Resistance - 10 reps - 3 sets - 3 hold - 1x daily - 3x weekly   Standing Shoulder Abduction with Resistance - 10 reps - 3 sets - 3 hold - 1x daily - 3x weekly   Supine Chest Press with Resistance - 10 reps - 3 sets - 3 hold - 1x daily - 3x weekly   Prone Single Arm Shoulder Y - 10 reps - 3 sets - 3 hold - 1x daily - 3x weekly   Prone Shoulder Horizontal Abduction - 10 reps - 3 sets - 3 hold - 1x daily - 3x weekly   Prone Shoulder Extension - 10 reps - 3 sets - 3 hold - 1x daily - 3x weekly   Shoulder Flexion Serratus Activation with Resistance - 10 reps - 3 sets - 3 hold - 1x daily -  3x weekly   Seated Shoulder Row with Resistance Anchored at Feet - 10 reps - 3 sets - 3 hold - 1x daily - 3x weekly   Prone Press Up - 10 reps - 3 sets - 5 hold - 1x daily - 3x weekly   Supine Lower Trunk Rotation - 10 reps - 3 sets - 5 hold - 1x daily - 3x weekly   Supine Bridge - 10 reps - 3 sets - 5 hold - 1x daily - 3x weekly   Quadruped Alternating Leg Extensions - 10 reps - 3 sets - 5 hold - 1x daily - 3x weekly   Bird Dog - 10 reps - 3 sets - 5 hold - 1x daily - 3x weekly   Anti-Rotation Sidestepping with Resistance - 10 reps - 3 sets - 5 hold - 1x daily - 3x weekly     Variance from POC: none  PT Patient Time In/Time Out  Time In: 0730  Time Out: 0815  Treatment time: [place], PT

## 2017-03-01 NOTE — Telephone Encounter (Signed)
Patient requesting refill on Crestor and Lotrel

## 2017-03-22 ENCOUNTER — Encounter

## 2017-03-22 NOTE — Progress Notes (Signed)
LM for pt to call back to schedule lab

## 2017-03-22 NOTE — Progress Notes (Signed)
Possible heavy metal exposure.  He requests lab draw.  Nonfasting.  Please call the patient and let him know he can have this done when convenient for the lab.

## 2017-04-05 ENCOUNTER — Encounter: Admit: 2017-04-05 | Discharge: 2017-04-05 | Payer: PRIVATE HEALTH INSURANCE | Primary: Internal Medicine

## 2017-04-05 DIAGNOSIS — Z77018 Contact with and (suspected) exposure to other hazardous metals: Secondary | ICD-10-CM

## 2017-04-07 LAB — HEAVY METALS PROFILE I, BLOOD
Arsenic: 5 ug/L (ref 2–23)
Lead, Blood: 1 ug/dL (ref 0–4)
Mercury, blood: 2.4 ug/L (ref 0.0–14.9)

## 2017-04-07 NOTE — Progress Notes (Signed)
Labs are normal.

## 2017-05-31 ENCOUNTER — Encounter: Primary: Internal Medicine

## 2017-06-02 ENCOUNTER — Encounter: Attending: Internal Medicine | Primary: Internal Medicine

## 2017-06-08 ENCOUNTER — Encounter: Admit: 2017-06-08 | Discharge: 2017-06-08 | Payer: PRIVATE HEALTH INSURANCE | Primary: Internal Medicine

## 2017-06-08 DIAGNOSIS — E7800 Pure hypercholesterolemia, unspecified: Secondary | ICD-10-CM

## 2017-06-09 LAB — LIPID PANEL
Cholesterol, total: 176 mg/dL (ref 100–199)
HDL Cholesterol: 70 mg/dL (ref >39)
LDL, calculated: 87 mg/dL (ref 0–99)
Triglyceride: 96 mg/dL (ref 0–149)
VLDL, calculated: 19 mg/dL (ref 5–40)

## 2017-06-09 LAB — CVD REPORT

## 2017-06-09 LAB — ALT: ALT (SGPT): 38 IU/L (ref 0–44)

## 2017-06-09 LAB — AST: AST (SGOT): 21 IU/L (ref 0–40)

## 2017-06-10 ENCOUNTER — Ambulatory Visit
Admit: 2017-06-10 | Discharge: 2017-06-10 | Payer: PRIVATE HEALTH INSURANCE | Attending: Internal Medicine | Primary: Internal Medicine

## 2017-06-10 DIAGNOSIS — I2510 Atherosclerotic heart disease of native coronary artery without angina pectoris: Secondary | ICD-10-CM

## 2017-06-10 MED ORDER — ZOLPIDEM 10 MG TAB
10 mg | ORAL_TABLET | Freq: Every evening | ORAL | 3 refills | Status: DC | PRN
Start: 2017-06-10 — End: 2019-01-06

## 2017-06-10 MED ORDER — VARICELLA-ZOSTER GLYCOE VACC-AS01B ADJ(PF) 50 MCG/0.5 ML IM SUSPENSION
50 mcg/0.5 mL | INTRAMUSCULAR | 1 refills | Status: DC
Start: 2017-06-10 — End: 2018-08-04

## 2017-06-10 MED ORDER — ROSUVASTATIN 5 MG TAB
5 mg | ORAL_TABLET | Freq: Every evening | ORAL | 3 refills | Status: DC
Start: 2017-06-10 — End: 2018-02-10

## 2017-06-10 MED ORDER — AMLODIPINE-BENAZEPRIL 5 MG-40 MG CAP
5-40 mg | ORAL_CAPSULE | Freq: Every day | ORAL | 3 refills | Status: DC
Start: 2017-06-10 — End: 2018-02-20

## 2017-06-10 MED ORDER — DIPHTH,PERTUSSIS(ACELL),TETANUS 2.5 LF UNIT-8 MCG-5 LF/0.5 ML IM SUSP
Freq: Once | INTRAMUSCULAR | 0 refills | Status: AC
Start: 2017-06-10 — End: 2017-06-10

## 2017-06-10 NOTE — Progress Notes (Signed)
HISTORY OF PRESENT ILLNESS  Willie Stewart is a 61 y.o. male.  Patient presents with:  Follow-up: 6 month recheck,discuss lab results    Labs reviewed.  Cholesterol improved.  Hx of CAD.      Rt foot fxr while sailing in [place].  About a month ago.  Wearing boot improving.  Small fracture of rt 5th metatarsal head.  No pain.  Xrays reviewed.      Heavy metal screen reviewed.  Neg.      Insomnia stable.  Medication refilled    BP stable.  Medication refilled    Lipids stable.  Medication refilled    Vaccine status reviewed.      Here with his wife.        Cholesterol Problem   The history is provided by the patient. This is a chronic problem. The problem occurs daily. The problem has been rapidly improving. Pertinent negatives include no chest pain, no abdominal pain, no headaches and no shortness of breath. The treatment provided significant relief.   Athletic injury   The history is provided by the patient. The current episode started more than 1 week ago. The problem has been rapidly improving. Pertinent negatives include no chest pain, no abdominal pain, no headaches and no shortness of breath. Nothing aggravates the symptoms. The symptoms are relieved by ice and rest. He has tried rest for the symptoms. The treatment provided significant relief.       Review of Systems   Constitutional: Negative for fever.   Respiratory: Negative for shortness of breath.    Cardiovascular: Negative for chest pain and leg swelling.   Gastrointestinal: Negative for abdominal pain.   Genitourinary: Negative for dysuria.   Musculoskeletal: Negative for falls.   Neurological: Negative for headaches.     Visit Vitals  BP 128/72 (BP 1 Location: Left arm, BP Patient Position: Sitting)   Pulse 66   Temp 97.5 ??F (36.4 ??C) (Oral)   Ht 5\' 6"  (1.676 m)   Wt 167 lb (75.8 kg)   SpO2 96%   BMI 26.95 kg/m??     BP Readings from Last 3 Encounters:   06/10/17 128/72   01/29/17 121/73   11/26/16 134/82     Wt Readings from Last 3 Encounters:    06/10/17 167 lb (75.8 kg)   01/29/17 163 lb 6.4 oz (74.1 kg)   01/25/17 165 lb (74.8 kg)       Physical Exam   Constitutional: He is oriented to person, place, and time. He appears well-developed and well-nourished.   HENT:   Head: Normocephalic and atraumatic.   Eyes: Pupils are equal, round, and reactive to light.   Neck: Normal range of motion.   Cardiovascular: Normal rate, regular rhythm, normal heart sounds and intact distal pulses.   Pulmonary/Chest: Effort normal and breath sounds normal.   Abdominal: Soft. Bowel sounds are normal.   Musculoskeletal: Normal range of motion.        Right foot: There is no tenderness and no swelling.        Feet:    Neurological: He is alert and oriented to person, place, and time. He has normal reflexes.   Skin: Skin is warm and dry.   Psychiatric: He has a normal mood and affect. His behavior is normal. Judgment and thought content normal.       ASSESSMENT and PLAN  1. Coronary artery disease involving native coronary artery of native heart without angina pectoris  Stable on current management.  No change in medications    - METABOLIC PANEL, COMPREHENSIVE; Future  - LIPID PANEL; Future  - CBC WITH AUTOMATED DIFF; Future  - PSA, DIAGNOSTIC (PROSTATE SPECIFIC AG); Future  - TSH 3RD GENERATION; Future  - URINALYSIS W/ RFLX MICROSCOPIC; Future  - PR CALC BMI ABV UP PARAM F/U    2. Closed fracture of right foot with routine healing, subsequent encounter  Stable on current management.  No change in medications  Healing well.  Continue rigid boot for 2 more weeks.  No further treatment or xrays needed.        3. Pure hypercholesterolemia  Stable on current management.  No change in medications    - rosuvastatin (CRESTOR) 5 mg tablet; Take 1 Tab by mouth nightly.  Dispense: 90 Tab; Refill: 3  - METABOLIC PANEL, COMPREHENSIVE; Future  - LIPID PANEL; Future  - CBC WITH AUTOMATED DIFF; Future  - PSA, DIAGNOSTIC (PROSTATE SPECIFIC AG); Future  - TSH 3RD GENERATION; Future   - URINALYSIS W/ RFLX MICROSCOPIC; Future  - PR CALC BMI ABV UP PARAM F/U    4. Essential hypertension  Stable on current management.  No change in medications    - amLODIPine-benazepril (LOTREL) 5-40 mg per capsule; Take 1 Cap by mouth daily.  Dispense: 90 Cap; Refill: 3  - METABOLIC PANEL, COMPREHENSIVE; Future  - LIPID PANEL; Future  - CBC WITH AUTOMATED DIFF; Future  - PSA, DIAGNOSTIC (PROSTATE SPECIFIC AG); Future  - TSH 3RD GENERATION; Future  - URINALYSIS W/ RFLX MICROSCOPIC; Future  - PR CALC BMI ABV UP PARAM F/U    5. Sleep disorder, circadian, shift work type  Stable on current management.  No change in medications    - zolpidem (AMBIEN) 10 mg tablet; Take 1 Tab by mouth nightly as needed for Sleep.  Dispense: 90 Tab; Refill: 3  - METABOLIC PANEL, COMPREHENSIVE; Future  - LIPID PANEL; Future  - CBC WITH AUTOMATED DIFF; Future  - PSA, DIAGNOSTIC (PROSTATE SPECIFIC AG); Future  - TSH 3RD GENERATION; Future  - URINALYSIS W/ RFLX MICROSCOPIC; Future    6. Mixed hyperlipidemia  Stable on current management.  No change in medications    - METABOLIC PANEL, COMPREHENSIVE; Future  - LIPID PANEL; Future  - CBC WITH AUTOMATED DIFF; Future  - PSA, DIAGNOSTIC (PROSTATE SPECIFIC AG); Future  - TSH 3RD GENERATION; Future  - URINALYSIS W/ RFLX MICROSCOPIC; Future  - PR CALC BMI ABV UP PARAM F/U    7. Need for zoster vaccine  The patient and I discussed the risk and benefits of shingles vaccine.  Given the patient's age I believe he would be a good candidate for this.    Based on CDC recommendations, I recommend the Shingrix Vaccine for you.  Healthy adults 50 years and older should get two doses of Shingrix, separated by 2 to 6 months. You should get Shingrix even if in the past you received Zostavax.   There is no maximum age for getting Shingrix.     If you had shingles in the past, you can get Shingrix to help prevent future occurrences of the disease. There is no specific length of time  that you need to wait after having shingles before you can receive Shingrix, but generally you should make sure the shingles rash has gone away before getting vaccinated.    A prescription is printed and recommended to be purchased and administered your local pharmacy       - varicella-zoster recombinant, PF, ([place],  PF,) 50 mcg/0.5 mL susr injection; IM: 0.5 mL administered as a 2-dose series at 0 and 2 to 6 months later  Dispense: 0.5 mL; Refill: 1    8. Need for Tdap vaccination   Rx printed.    - diphtheria-pertussis, acellular,-tetanus (ACELL) 2.5-8-5 Lf-mcg-Lf/0.15mL susp susp; 0.5 mL by IntraMUSCular route once for 1 dose.  Dispense: 0.5 mL; Refill: 0      ICD-10-CM ICD-9-CM    1. Coronary artery disease involving native coronary artery of native heart without angina pectoris U98.11 914.78 METABOLIC PANEL, COMPREHENSIVE      LIPID PANEL      CBC WITH AUTOMATED DIFF      PSA, DIAGNOSTIC (PROSTATE SPECIFIC AG)      TSH 3RD GENERATION      URINALYSIS W/ RFLX MICROSCOPIC      PR CALC BMI ABV UP PARAM F/U   2. Closed fracture of right foot with routine healing, subsequent encounter S92.901D V54.16    3. Pure hypercholesterolemia E78.00 272.0 rosuvastatin (CRESTOR) 5 mg tablet      METABOLIC PANEL, COMPREHENSIVE      LIPID PANEL      CBC WITH AUTOMATED DIFF      PSA, DIAGNOSTIC (PROSTATE SPECIFIC AG)      TSH 3RD GENERATION      URINALYSIS W/ RFLX MICROSCOPIC      PR CALC BMI ABV UP PARAM F/U   4. Essential hypertension I10 401.9 amLODIPine-benazepril (LOTREL) 5-40 mg per capsule      METABOLIC PANEL, COMPREHENSIVE      LIPID PANEL      CBC WITH AUTOMATED DIFF      PSA, DIAGNOSTIC (PROSTATE SPECIFIC AG)      TSH 3RD GENERATION      URINALYSIS W/ RFLX MICROSCOPIC      PR CALC BMI ABV UP PARAM F/U   5. Sleep disorder, circadian, shift work type G47.26 327.36 zolpidem (AMBIEN) 10 mg tablet      METABOLIC PANEL, COMPREHENSIVE      LIPID PANEL      CBC WITH AUTOMATED DIFF      PSA, DIAGNOSTIC (PROSTATE SPECIFIC AG)       TSH 3RD GENERATION      URINALYSIS W/ RFLX MICROSCOPIC   6. Mixed hyperlipidemia G95.6 213.0 METABOLIC PANEL, COMPREHENSIVE      LIPID PANEL      CBC WITH AUTOMATED DIFF      PSA, DIAGNOSTIC (PROSTATE SPECIFIC AG)      TSH 3RD GENERATION      URINALYSIS W/ RFLX MICROSCOPIC      PR CALC BMI ABV UP PARAM F/U   7. Need for zoster vaccine Z23 V04.89 varicella-zoster recombinant, PF, (SHINGRIX, PF,) 50 mcg/0.5 mL susr injection   8. Need for Tdap vaccination Z23 V06.1 diphtheria-pertussis, acellular,-tetanus (ACELL) 2.5-8-5 Lf-mcg-Lf/0.68mL susp susp   9. Screening for viral disease Z11.59 V73.99 HEPATITIS C AB     Diagnoses and all orders for this visit:    1. Coronary artery disease involving native coronary artery of native heart without angina pectoris  -     METABOLIC PANEL, COMPREHENSIVE; Future  -     LIPID PANEL; Future  -     CBC WITH AUTOMATED DIFF; Future  -     PSA, DIAGNOSTIC (PROSTATE SPECIFIC AG); Future  -     TSH 3RD GENERATION; Future  -     URINALYSIS W/ RFLX MICROSCOPIC; Future  -     PR CALC BMI ABV UP PARAM  F/U    2. Closed fracture of right foot with routine healing, subsequent encounter    3. Pure hypercholesterolemia  -     rosuvastatin (CRESTOR) 5 mg tablet; Take 1 Tab by mouth nightly.  -     METABOLIC PANEL, COMPREHENSIVE; Future  -     LIPID PANEL; Future  -     CBC WITH AUTOMATED DIFF; Future  -     PSA, DIAGNOSTIC (PROSTATE SPECIFIC AG); Future  -     TSH 3RD GENERATION; Future  -     URINALYSIS W/ RFLX MICROSCOPIC; Future  -     PR CALC BMI ABV UP PARAM F/U    4. Essential hypertension  -     amLODIPine-benazepril (LOTREL) 5-40 mg per capsule; Take 1 Cap by mouth daily.  -     METABOLIC PANEL, COMPREHENSIVE; Future  -     LIPID PANEL; Future  -     CBC WITH AUTOMATED DIFF; Future  -     PSA, DIAGNOSTIC (PROSTATE SPECIFIC AG); Future  -     TSH 3RD GENERATION; Future  -     URINALYSIS W/ RFLX MICROSCOPIC; Future  -     PR CALC BMI ABV UP PARAM F/U     5. Sleep disorder, circadian, shift work type  -     zolpidem (AMBIEN) 10 mg tablet; Take 1 Tab by mouth nightly as needed for Sleep.  -     METABOLIC PANEL, COMPREHENSIVE; Future  -     LIPID PANEL; Future  -     CBC WITH AUTOMATED DIFF; Future  -     PSA, DIAGNOSTIC (PROSTATE SPECIFIC AG); Future  -     TSH 3RD GENERATION; Future  -     URINALYSIS W/ RFLX MICROSCOPIC; Future    6. Mixed hyperlipidemia  -     METABOLIC PANEL, COMPREHENSIVE; Future  -     LIPID PANEL; Future  -     CBC WITH AUTOMATED DIFF; Future  -     PSA, DIAGNOSTIC (PROSTATE SPECIFIC AG); Future  -     TSH 3RD GENERATION; Future  -     URINALYSIS W/ RFLX MICROSCOPIC; Future  -     PR CALC BMI ABV UP PARAM F/U    7. Need for zoster vaccine  -     varicella-zoster recombinant, PF, (SHINGRIX, PF,) 50 mcg/0.5 mL susr injection; IM: 0.5 mL administered as a 2-dose series at 0 and 2 to 6 months later    8. Need for Tdap vaccination  -     diphtheria-pertussis, acellular,-tetanus (ACELL) 2.5-8-5 Lf-mcg-Lf/0.46mL susp susp; 0.5 mL by IntraMUSCular route once for 1 dose.    9. Screening for viral disease  -     HEPATITIS C AB; Future      Follow-up Disposition:  Return in about 1 year (around 06/10/2018).  The patient's abnormal BMI was reviewed and deemed target BMI for the patient.  An After Visit Summary was provided to the patient and/or caregiver.

## 2017-06-10 NOTE — Patient Instructions (Addendum)
Body Mass Index: Care Instructions  Your Care Instructions    Body mass index (BMI) can help you see if your weight is raising your risk for health problems. It uses a formula to compare how much you weigh with how tall you are.  ?? A BMI lower than 18.5 is considered underweight.  ?? A BMI between 18.5 and 24.9 is considered healthy.  ?? A BMI between 25 and 29.9 is considered overweight. A BMI of 30 or higher is considered obese.  If your BMI is in the normal range, it means that you have a lower risk for weight-related health problems. If your BMI is in the overweight or obese range, you may be at increased risk for weight-related health problems, such as high blood pressure, heart disease, stroke, arthritis or joint pain, and diabetes. If your BMI is in the underweight range, you may be at increased risk for health problems such as fatigue, lower protection (immunity) against illness, muscle loss, bone loss, hair loss, and hormone problems.  BMI is just one measure of your risk for weight-related health problems. You may be at higher risk for health problems if you are not active, you eat an unhealthy diet, or you drink too much alcohol or use tobacco products.  Follow-up care is a key part of your treatment and safety. Be sure to make and go to all appointments, and call your doctor if you are having problems. It's also a good idea to know your test results and keep a list of the medicines you take.  How can you care for yourself at home?  ?? Practice healthy eating habits. This includes eating plenty of fruits, vegetables, whole grains, lean protein, and low-fat dairy.  ?? If your doctor recommends it, get more exercise. Walking is a good choice. Bit by bit, increase the amount you walk every day. Try for at least 30 minutes on most days of the week.  ?? Do not smoke. Smoking can increase your risk for health problems. If you need help quitting, talk to your doctor about stop-smoking programs and  medicines. These can increase your chances of quitting for good.  ?? Limit alcohol to 2 drinks a day for men and 1 drink a day for women. Too much alcohol can cause health problems.  If you have a BMI higher than 25  ?? Your doctor may do other tests to check your risk for weight-related health problems. This may include measuring the distance around your waist. A waist measurement of more than 40 inches in men or 35 inches in women can increase the risk of weight-related health problems.  ?? Talk with your doctor about steps you can take to stay healthy or improve your health. You may need to make lifestyle changes to lose weight and stay healthy, such as changing your diet and getting regular exercise.  If you have a BMI lower than 18.5  ?? Your doctor may do other tests to check your risk for health problems.  ?? Talk with your doctor about steps you can take to stay healthy or improve your health. You may need to make lifestyle changes to gain or maintain weight and stay healthy, such as getting more healthy foods in your diet and doing exercises to build muscle.  Where can you learn more?  Go to http://www.healthwise.net/[facility].  Enter S176 in the search box to learn more about "Body Mass Index: Care Instructions."  Current as of: May 09, 2015  Content Version: 11.4  ??   2006-2017 [employer]. Care instructions adapted under license by [employer] (which disclaims liability or warranty for this information). If you have questions about a medical condition or this instruction, always ask your healthcare professional. Healthwise, Inc[employer] disclaims any warranty or liability for your use of this information.

## 2017-08-20 ENCOUNTER — Encounter

## 2017-08-20 NOTE — Progress Notes (Signed)
See email.    Xray foot ordered.

## 2017-08-24 ENCOUNTER — Ambulatory Visit

## 2017-08-24 ENCOUNTER — Inpatient Hospital Stay: Admit: 2017-08-24 | Payer: PRIVATE HEALTH INSURANCE | Attending: Internal Medicine | Primary: Internal Medicine

## 2017-08-24 DIAGNOSIS — S92351D Displaced fracture of fifth metatarsal bone, right foot, subsequent encounter for fracture with routine healing: Secondary | ICD-10-CM

## 2017-08-25 ENCOUNTER — Telehealth

## 2017-08-25 NOTE — Telephone Encounter (Signed)
I called the patient and reviewed his most recent follow-up x-ray.  He has been still having some pain occasionally with certain movements.  He is able to walk pain-free he says for the most part.  Proximal fifth metatarsal fracture.  Radiologist felt like there was some cortical sclerosis so it may either be a chronic nonhealing fracture or subacute fracture he had worn a boot for 5 weeks.  The initial injury was in [place].  I would like for him to consult with an orthopedist about an opinion.  Referral will be made.

## 2017-08-26 NOTE — Telephone Encounter (Signed)
Referral opened in [facility] to POA on 08/25/17

## 2017-09-10 NOTE — Telephone Encounter (Signed)
Pt is requesting refill. thanks

## 2017-09-13 MED ORDER — MOMETASONE 0.1 % TOPICAL CREAM
0.1 % | Freq: Two times a day (BID) | CUTANEOUS | 0 refills | Status: DC
Start: 2017-09-13 — End: 2018-02-10

## 2017-09-13 NOTE — Telephone Encounter (Signed)
Pt notified med sent

## 2018-02-10 ENCOUNTER — Encounter

## 2018-02-10 MED ORDER — ROSUVASTATIN 5 MG TAB
5 mg | ORAL_TABLET | Freq: Every evening | ORAL | 3 refills | Status: DC
Start: 2018-02-10 — End: 2018-08-17

## 2018-02-10 MED ORDER — MOMETASONE 0.1 % TOPICAL CREAM
0.1 % | Freq: Two times a day (BID) | CUTANEOUS | 0 refills | Status: DC
Start: 2018-02-10 — End: 2019-05-22

## 2018-02-10 NOTE — Telephone Encounter (Signed)
Dr Venetia Constable Patient- requesting refills for Crestor and Elocon

## 2018-02-11 NOTE — Telephone Encounter (Signed)
Patient's wife notified that prescriptions were sent to pharmacy

## 2018-02-20 ENCOUNTER — Encounter

## 2018-02-21 MED ORDER — AMLODIPINE-BENAZEPRIL 5 MG-40 MG CAP
5-40 mg | ORAL_CAPSULE | ORAL | 3 refills | Status: DC
Start: 2018-02-21 — End: 2018-08-17

## 2018-05-31 ENCOUNTER — Encounter: Admit: 2018-05-31 | Discharge: 2018-05-31 | Payer: PRIVATE HEALTH INSURANCE | Primary: Internal Medicine

## 2018-05-31 ENCOUNTER — Encounter: Primary: Internal Medicine

## 2018-05-31 DIAGNOSIS — Z1159 Encounter for screening for other viral diseases: Secondary | ICD-10-CM

## 2018-06-01 LAB — CBC WITH AUTOMATED DIFF
ABS. BASOPHILS: 0 10*3/uL (ref 0.0–0.2)
ABS. EOSINOPHILS: 0.1 10*3/uL (ref 0.0–0.4)
ABS. IMM. GRANS.: 0 10*3/uL (ref 0.0–0.1)
ABS. MONOCYTES: 0.5 10*3/uL (ref 0.1–0.9)
ABS. NEUTROPHILS: 3.5 10*3/uL (ref 1.4–7.0)
Abs Lymphocytes: 1.3 10*3/uL (ref 0.7–3.1)
BASOPHILS: 1 %
EOSINOPHILS: 1 %
HCT: 40 % (ref 37.5–51.0)
HGB: 13.5 g/dL (ref 13.0–17.7)
IMMATURE GRANULOCYTES: 0 %
Lymphocytes: 23 %
MCH: 29.8 pg (ref 26.6–33.0)
MCHC: 33.8 g/dL (ref 31.5–35.7)
MCV: 88 fL (ref 79–97)
MONOCYTES: 10 %
NEUTROPHILS: 65 %
PLATELET: 204 10*3/uL (ref 150–450)
RBC: 4.53 x10E6/uL (ref 4.14–5.80)
RDW: 12.3 % (ref 12.3–15.4)
WBC: 5.4 10*3/uL (ref 3.4–10.8)

## 2018-06-01 LAB — METABOLIC PANEL, COMPREHENSIVE
A-G Ratio: 1.9 (ref 1.2–2.2)
ALT (SGPT): 20 IU/L (ref 0–44)
AST (SGOT): 19 IU/L (ref 0–40)
Albumin: 4.5 g/dL (ref 3.6–4.8)
Alk. phosphatase: 37 IU/L — ABNORMAL LOW (ref 39–117)
BUN/Creatinine ratio: 15 (ref 10–24)
BUN: 19 mg/dL (ref 8–27)
Bilirubin, total: 1 mg/dL (ref 0.0–1.2)
CO2: 24 mmol/L (ref 20–29)
Calcium: 9.6 mg/dL (ref 8.6–10.2)
Chloride: 102 mmol/L (ref 96–106)
Creatinine: 1.29 mg/dL — ABNORMAL HIGH (ref 0.76–1.27)
GFR est AA: 68 mL/min/{1.73_m2} (ref >59)
GFR est non-AA: 59 mL/min/{1.73_m2} — ABNORMAL LOW (ref >59)
GLOBULIN, TOTAL: 2.4 g/dL (ref 1.5–4.5)
Glucose: 97 mg/dL (ref 65–99)
Potassium: 4.6 mmol/L (ref 3.5–5.2)
Protein, total: 6.9 g/dL (ref 6.0–8.5)
Sodium: 141 mmol/L (ref 134–144)

## 2018-06-01 LAB — URINALYSIS W/ RFLX MICROSCOPIC
Bilirubin, Urine: NEGATIVE
Bilirubin: NEGATIVE
Blood, Urine: NEGATIVE
Blood: NEGATIVE
Glucose, UA: NEGATIVE
Glucose: NEGATIVE
Ketone: NEGATIVE
Ketones, Urine: NEGATIVE
Leukocyte Esterase, Urine: NEGATIVE
Leukocyte Esterase: NEGATIVE
Nitrite, Urine: NEGATIVE
Nitrites: NEGATIVE
Protein, UA: NEGATIVE
Protein: NEGATIVE
Specific Gravity, UA: 1.007 NA (ref 1.005–1.030)
Specific Gravity: 1.007 (ref 1.005–1.030)
Urobilinogen, Urine: 0.2 mg/dL (ref 0.2–1.0)
Urobilinogen: 0.2 mg/dL (ref 0.2–1.0)
pH (UA): 6.5 (ref 5.0–7.5)
pH, UA: 6.5 NA (ref 5.0–7.5)

## 2018-06-01 LAB — LIPID PANEL
Cholesterol, Total: 197 mg/dL (ref 100–199)
Cholesterol, total: 197 mg/dL (ref 100–199)
HDL Cholesterol: 74 mg/dL (ref >39)
HDL: 74 mg/dL (ref >39)
LDL Calculated: 108 mg/dL — ABNORMAL HIGH (ref 0–99)
LDL, calculated: 108 mg/dL — ABNORMAL HIGH (ref 0–99)
Triglyceride: 76 mg/dL (ref 0–149)
Triglycerides: 76 mg/dL (ref 0–149)
VLDL Cholesterol Calculated: 15 mg/dL (ref 5–40)
VLDL, calculated: 15 mg/dL (ref 5–40)

## 2018-06-01 LAB — PSA, DIAGNOSTIC (PROSTATE SPECIFIC AG): Prostate Specific Ag: 3.9 ng/mL (ref 0.0–4.0)

## 2018-06-01 LAB — TSH 3RD GENERATION
TSH: 1.27 u[IU]/mL (ref 0.450–4.500)
TSH: 1.27 u[IU]/mL (ref 0.450–4.500)

## 2018-06-01 LAB — CVD REPORT

## 2018-06-01 LAB — CKD REPORT

## 2018-06-01 LAB — HEPATITIS C AB: Hep C Virus Ab: <0.1 s/co ratio (ref 0.0–0.9)

## 2018-06-01 LAB — CBC WITH AUTO DIFFERENTIAL
Basophils %: 1 %
Basophils Absolute: 0 10*3/uL (ref 0.0–0.2)
Eosinophils %: 1 %
Eosinophils Absolute: 0.1 10*3/uL (ref 0.0–0.4)
Granulocyte Absolute Count: 0 10*3/uL (ref 0.0–0.1)
Hematocrit: 40 % (ref 37.5–51.0)
Hemoglobin: 13.5 g/dL (ref 13.0–17.7)
Immature Granulocytes: 0 %
Lymphocytes %: 23 %
Lymphocytes Absolute: 1.3 10*3/uL (ref 0.7–3.1)
MCH: 29.8 pg (ref 26.6–33.0)
MCHC: 33.8 g/dL (ref 31.5–35.7)
MCV: 88 fL (ref 79–97)
Monocytes %: 10 %
Monocytes Absolute: 0.5 10*3/uL (ref 0.1–0.9)
Neutrophils %: 65 %
Neutrophils Absolute: 3.5 10*3/uL (ref 1.4–7.0)
Platelets: 204 10*3/uL (ref 150–450)
RBC: 4.53 x10E6/uL (ref 4.14–5.80)
RDW: 12.3 % (ref 12.3–15.4)
WBC: 5.4 10*3/uL (ref 3.4–10.8)

## 2018-06-01 LAB — COMPREHENSIVE METABOLIC PANEL
ALT: 20 IU/L (ref 0–44)
AST: 19 IU/L (ref 0–40)
Albumin/Globulin Ratio: 1.9 NA (ref 1.2–2.2)
Albumin: 4.5 g/dL (ref 3.6–4.8)
Alkaline Phosphatase: 37 IU/L — ABNORMAL LOW (ref 39–117)
BUN: 19 mg/dL (ref 8–27)
Bun/Cre Ratio: 15 NA (ref 10–24)
CO2: 24 mmol/L (ref 20–29)
Calcium: 9.6 mg/dL (ref 8.6–10.2)
Chloride: 102 mmol/L (ref 96–106)
Creatinine: 1.29 mg/dL — ABNORMAL HIGH (ref 0.76–1.27)
EGFR IF NonAfrican American: 59 mL/min/{1.73_m2} — ABNORMAL LOW (ref >59)
GFR African American: 68 mL/min/{1.73_m2} (ref >59)
Globulin, Total: 2.4 g/dL (ref 1.5–4.5)
Glucose: 97 mg/dL (ref 65–99)
Potassium: 4.6 mmol/L (ref 3.5–5.2)
Sodium: 141 mmol/L (ref 134–144)
Total Bilirubin: 1 mg/dL (ref 0.0–1.2)
Total Protein: 6.9 g/dL (ref 6.0–8.5)

## 2018-06-01 LAB — PSA PROSTATIC SPECIFIC ANTIGEN: PSA: 3.9 ng/mL (ref 0.0–4.0)

## 2018-06-01 LAB — HEPATITIS C ANTIBODY: HCV Ab: <0.1 s/co ratio (ref 0.0–0.9)

## 2018-06-07 ENCOUNTER — Encounter: Primary: Internal Medicine

## 2018-06-10 ENCOUNTER — Encounter: Primary: Internal Medicine

## 2018-06-13 ENCOUNTER — Ambulatory Visit: Attending: Internal Medicine | Primary: Internal Medicine

## 2018-06-13 ENCOUNTER — Ambulatory Visit
Admit: 2018-06-13 | Discharge: 2018-06-13 | Payer: PRIVATE HEALTH INSURANCE | Attending: Internal Medicine | Primary: Internal Medicine

## 2018-06-13 DIAGNOSIS — Z0000 Encounter for general adult medical examination without abnormal findings: Secondary | ICD-10-CM

## 2018-06-13 MED ORDER — LORAZEPAM 0.5 MG TAB
0.5 mg | ORAL_TABLET | Freq: Every evening | ORAL | 0 refills | Status: DC | PRN
Start: 2018-06-13 — End: 2018-08-04

## 2018-06-13 NOTE — Progress Notes (Signed)
Chief Complaint   Patient presents with   ??? Complete Physical       The patient is a 62 y.o. male who is seen for routine checkup.      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  Rt foot fxr while sailing in [place].  About a month ago.  Wearing boot improving.  Small fracture of rt 5th metatarsal head.  No pain.  Xrays reviewed.  Has seen orthopedics.  Patient prefers to not have surgery.      He is tolerating his blood pressure medications and his blood pressure is well controlled.  He exercises on a regular basis and is a non-smoker.  No chest pain shortness of breath or abdominal pain.    He has occasional insomnia with travel that is alleviated by Ambien.  However, his wife is complaining that he thrashes around a lot when taking.  His sleep patterns have been a little more difficult since he is retired now and not traveling as much.  More nasal congestion?  Prior history of septoplasty for deviated septum.  Worsening?    Cholesterol is generally pretty well controlled.    Stress echo in May of 2017 while living in [place].  Notes reviewed.  Negative exam.    Medication refilled    Labs reviewed.      Health Maintenance   Topic Date Due   ??? Shingrix Vaccine Age 3> (1 of 2) 09/19/2004   ??? DTaP/Tdap/Td series (2 - Td) 08/24/2016   ??? Influenza Age 80 to Adult  06/14/2019 (Originally 02/24/2018)   ??? COLONOSCOPY  01/30/2027   ??? Hepatitis C Screening  Completed   ??? Pneumococcal 0-64 years  Aged Out       Patient Active Problem List   Diagnosis Code   ??? HTN (hypertension) I10   ??? Mixed hyperlipidemia E78.2   ??? 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 CAD (coronary artery disease) I25.10   ??? Hemangioma of liver D18.03   ??? 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 Prostate nodule N40.2   ??? 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      Lab Results   Component Value Date/Time    WBC 5.4 05/31/2018 10:57 AM    HGB 13.5 05/31/2018 10:57 AM    HCT 40.0 05/31/2018 10:57 AM    PLATELET 204  05/31/2018 10:57 AM    MCV 88 05/31/2018 10:57 AM       Lab Results   Component Value Date/Time    Glucose 97 05/31/2018 10:57 AM    LDL, calculated 108 (H) 05/31/2018 10:57 AM    Creatinine 1.29 (H) 05/31/2018 10:57 AM      Lab Results   Component Value Date/Time    Cholesterol, total 197 05/31/2018 10:57 AM    HDL Cholesterol 74 05/31/2018 10:57 AM    LDL, calculated 108 (H) 05/31/2018 10:57 AM    Triglyceride 76 05/31/2018 10:57 AM       Lab Results   Component Value Date/Time    ALT (SGPT) 20 05/31/2018 10:57 AM    AST (SGOT) 19 05/31/2018 10:57 AM    Alk. phosphatase 37 (L) 05/31/2018 10:57 AM    Bilirubin, total 1.0 05/31/2018 10:57 AM       Lab Results   Component Value Date/Time    GFR est AA 68 05/31/2018 10:57 AM    GFR est non-AA 59 (L) 05/31/2018 10:57 AM    Creatinine 1.29 (H) 05/31/2018  10:57 AM    BUN 19 05/31/2018 10:57 AM    Sodium 141 05/31/2018 10:57 AM    Potassium 4.6 05/31/2018 10:57 AM    Chloride 102 05/31/2018 10:57 AM    CO2 24 05/31/2018 10:57 AM      Lab Results   Component Value Date/Time    Prostate Specific Ag 3.9 05/31/2018 10:57 AM    Prostate Specific Ag 2.9 03/06/2015 10:48 AM    Prostate Specific Ag 2.6 04/09/2014 10:05 AM     Lab Results   Component Value Date/Time    TSH 1.270 05/31/2018 10:57 AM      Lab Results   Component Value Date/Time    Glucose 97 05/31/2018 10:57 AM         Health Maintenance   Topic Date Due   ??? Shingrix Vaccine Age 41> (1 of 2) 09/19/2004   ??? DTaP/Tdap/Td series (2 - Td) 08/24/2016   ??? Influenza Age 10 to Adult  06/14/2019 (Originally 02/24/2018)   ??? COLONOSCOPY  01/30/2027   ??? Hepatitis C Screening  Completed   ??? Pneumococcal 0-64 years  Aged Out       Past Medical History:   Diagnosis Date   ??? Atopic dermatitis    ??? Heavy metal exposure 03/22/2017   ??? Hypercholesteremia    ??? Hypertension     Controlled with medication    ??? Insomnia        Past Surgical History:   Procedure Laterality Date   ??? COLONOSCOPY N/A 01/29/2017    COLONOSCOPY/ 26 performed by  Ernst Breach, MD at [facility]   ??? HX COLONOSCOPY  2006    benign   ??? HX HERNIA REPAIR  1979       Current Outpatient Medications   Medication Sig   ??? LORazepam (ATIVAN) 0.5 mg tablet Take 1 Tab by mouth nightly as needed (insomnia). Max Daily Amount: 0.5 mg. Indications: difficulty sleeping   ??? amLODIPine-benazepril (LOTREL) 5-40 mg per capsule TAKE 1 CAPSULE DAILY   ??? rosuvastatin (CRESTOR) 5 mg tablet Take 1 Tab by mouth nightly. Indications: excessive fat in the blood, primary prevention of heart attack   ??? mometasone (ELOCON) 0.1 % topical cream Apply  to affected area two (2) times a day. Indications: Atopic Dermatitis   ??? zolpidem (AMBIEN) 10 mg tablet Take 1 Tab by mouth nightly as needed for Sleep.   ??? varicella-zoster recombinant, PF, (SHINGRIX, PF,) 50 mcg/0.5 mL susr injection IM: 0.5 mL administered as a 2-dose series at 0 and 2 to 6 months later   ??? acetaminophen (TYLENOL) 325 mg tablet Take  by mouth every four (4) hours as needed for Pain.   ??? ibuprofen (ADVIL) 200 mg tablet Take  by mouth.   ??? b complex vitamins tablet Take 1 tablet by mouth daily.   ??? ascorbic acid (VITAMIN C) 500 mg tablet Take 1,000 mg by mouth daily.   ??? cholecalciferol, vitamin D3, (VITAMIN D3) 2,000 unit tab Take  by mouth.   ??? Glucosam-Chon-Collag-Hyalur Ac 375-300-50-2 mg cap Take  by mouth daily.   ??? coenzyme q10-vitamin e (COQ10 SG 100) 100-100 mg-unit cap Take  by mouth.     No current facility-administered medications for this visit.        Social History     Socioeconomic History   ??? Marital status: MARRIED     Spouse name: Not on file   ??? Number of children: Not on file   ??? Years of  education: Not on file   ??? Highest education level: Not on file   Tobacco Use   ??? Smoking status: Never Smoker   ??? Smokeless tobacco: Never Used   Substance and Sexual Activity   ??? Alcohol use: Yes     Comment: socially   ??? Drug use: No   ??? Sexual activity: Yes     Partners: Female       Family History   Problem Relation Age of Onset    ??? Cancer Mother         breast and Leukemia   ??? Heart Disease Father         CAD   ??? Hypertension Father    ??? Cancer Father         Prostate       No Known Allergies    Results for orders placed or performed in visit on 05/31/18   HEPATITIS C AB   Result Value Ref Range    Hep C Virus Ab <0.1 0.0 - 0.9 s/co ratio   URINALYSIS W/ RFLX MICROSCOPIC   Result Value Ref Range    Specific Gravity 1.007 1.005 - 1.030    pH (UA) 6.5 5.0 - 7.5    Color Yellow Yellow    Appearance Clear Clear    Leukocyte Esterase Negative Negative    Protein Negative Negative/Trace    Glucose Negative Negative    Ketone Negative Negative    Blood Negative Negative    Bilirubin Negative Negative    Urobilinogen 0.2 0.2 - 1.0 mg/dL    Nitrites Negative Negative    Microscopic Examination Comment    TSH 3RD GENERATION   Result Value Ref Range    TSH 1.270 0.450 - 4.500 uIU/mL   PSA, DIAGNOSTIC (PROSTATE SPECIFIC AG)   Result Value Ref Range    Prostate Specific Ag 3.9 0.0 - 4.0 ng/mL   LIPID PANEL   Result Value Ref Range    Cholesterol, total 197 100 - 199 mg/dL    Triglyceride 76 0 - 149 mg/dL    HDL Cholesterol 74 >39 mg/dL    VLDL, calculated 15 5 - 40 mg/dL    LDL, calculated 108 (H) 0 - 99 mg/dL   METABOLIC PANEL, COMPREHENSIVE   Result Value Ref Range    Glucose 97 65 - 99 mg/dL    BUN 19 8 - 27 mg/dL    Creatinine 1.29 (H) 0.76 - 1.27 mg/dL    GFR est non-AA 59 (L) >59 mL/min/1.73    GFR est AA 68 >59 mL/min/1.73    BUN/Creatinine ratio 15 10 - 24    Sodium 141 134 - 144 mmol/L    Potassium 4.6 3.5 - 5.2 mmol/L    Chloride 102 96 - 106 mmol/L    CO2 24 20 - 29 mmol/L    Calcium 9.6 8.6 - 10.2 mg/dL    Protein, total 6.9 6.0 - 8.5 g/dL    Albumin 4.5 3.6 - 4.8 g/dL    GLOBULIN, TOTAL 2.4 1.5 - 4.5 g/dL    A-G Ratio 1.9 1.2 - 2.2    Bilirubin, total 1.0 0.0 - 1.2 mg/dL    Alk. phosphatase 37 (L) 39 - 117 IU/L    AST (SGOT) 19 0 - 40 IU/L    ALT (SGPT) 20 0 - 44 IU/L   CBC WITH AUTOMATED DIFF   Result Value Ref Range    WBC 5.4 3.4 - 10.8  x10E3/uL    RBC  4.53 4.14 - 5.80 x10E6/uL    HGB 13.5 13.0 - 17.7 g/dL    HCT 40.0 37.5 - 51.0 %    MCV 88 79 - 97 fL    MCH 29.8 26.6 - 33.0 pg    MCHC 33.8 31.5 - 35.7 g/dL    RDW 12.3 12.3 - 15.4 %    PLATELET 204 150 - 450 x10E3/uL    NEUTROPHILS 65 Not Estab. %    Lymphocytes 23 Not Estab. %    MONOCYTES 10 Not Estab. %    EOSINOPHILS 1 Not Estab. %    BASOPHILS 1 Not Estab. %    ABS. NEUTROPHILS 3.5 1.4 - 7.0 x10E3/uL    Abs Lymphocytes 1.3 0.7 - 3.1 x10E3/uL    ABS. MONOCYTES 0.5 0.1 - 0.9 x10E3/uL    ABS. EOSINOPHILS 0.1 0.0 - 0.4 x10E3/uL    ABS. BASOPHILS 0.0 0.0 - 0.2 x10E3/uL    IMMATURE GRANULOCYTES 0 Not Estab. %    ABS. IMM. GRANS. 0.0 0.0 - 0.1 x10E3/uL   CVD REPORT   Result Value Ref Range    INTERPRETATION Note     PDF IMAGE Not applicable    CKD REPORT   Result Value Ref Range    Interpretation Note        Review of Systems   Constitutional: Negative.    HENT: Negative.  Negative for congestion, facial swelling, hearing loss, mouth sores, nosebleeds, sore throat, tinnitus and trouble swallowing.    Eyes: Positive for visual disturbance.   Respiratory: Negative.    Cardiovascular: Negative.    Gastrointestinal: Negative.  Negative for blood in stool, constipation, diarrhea and nausea.   Genitourinary: Positive for frequency. Negative for hematuria.   Musculoskeletal: Negative for arthralgias and neck stiffness.   Skin: Positive for color change.   Neurological: Negative.    Psychiatric/Behavioral: Negative.  Negative for sleep disturbance.       Visit Vitals  BP 130/76 (BP 1 Location: Left arm, BP Patient Position: Sitting)   Pulse 96   Temp 98.7 ??F (37.1 ??C) (Oral)   Ht _0  (1.676 m)   Wt 159 lb 3.2 oz (72.2 kg)   SpO2 96%   BMI 25.70 kg/m??     Wt Readings from Last 3 Encounters:   06/13/18 159 lb 3.2 oz (72.2 kg)   06/10/17 167 lb (75.8 kg)   01/29/17 163 lb 6.4 oz (74.1 kg)     BP Readings from Last 3 Encounters:   06/13/18 130/76   06/10/17 128/72   01/29/17 121/73         Physical Exam    Constitutional: He is oriented to person, place, and time. He appears well-developed and well-nourished.   HENT:   Head: Normocephalic and atraumatic.   Eyes: Pupils are equal, round, and reactive to light. No scleral icterus.   Neck: Neck supple.   Cardiovascular: Normal rate, regular rhythm and normal heart sounds.   No murmur heard.  Pulmonary/Chest: Effort normal and breath sounds normal.   Abdominal: Hernia confirmed negative in the right inguinal area and confirmed negative in the left inguinal area.   Genitourinary: Testes normal and penis normal. Rectal exam shows external hemorrhoid. Rectal exam shows no mass and no tenderness. Prostate is not enlarged and not tender.   Musculoskeletal: He exhibits no edema.   Lymphadenopathy: No inguinal adenopathy noted on the right or left side.   Neurological: He is alert and oriented to person, place, and time.   Skin:  Skin is warm and dry. No pallor.   Psychiatric: He has a normal mood and affect. His behavior is normal.   Nursing note and vitals reviewed.      Assessment and Plan     1. Routine physical examination   This patient presents for routine wellness examination and state of health at this time.  Healthy lifestyle, diet, and exercise routines are discussed.  Routine health maintenance issues are reviewed and discussed and ordered and/or completed as appropriate.  Routine labs are reviewed and/or ordered as appropriate.      2. Sleep disorder, circadian, shift work type     - LORazepam (ATIVAN) 0.5 mg tablet; Take 1 Tab by mouth nightly as needed (insomnia). Max Daily Amount: 0.5 mg. Indications: difficulty sleeping  Dispense: 30 Tab; Refill: 0    3. Nasal septal deviation  Refer to Dr. Clement Husbands to examine.      - REFERRAL TO ENT-OTOLARYNGOLOGY    4. Prostate nodule  Lab Results   Component Value Date/Time    Prostate Specific Ag 3.9 05/31/2018 10:57 AM    Prostate Specific Ag 2.9 03/06/2015 10:48 AM    Prostate Specific Ag 2.6 04/09/2014 10:05 AM     Rt  prostate nodule.     - REFERRAL TO UROLOGY    5. Sun-damaged skin  Routine derm exam.    - REFERRAL TO DERMATOLOGY    6. Essential hypertension  BP Readings from Last 3 Encounters:   06/13/18 130/76   06/10/17 128/72   01/29/17 121/73     Stable on current management.  No change in medications      7. Mixed hyperlipidemia  Lab Results   Component Value Date/Time    LDL, calculated 108 (H) 05/31/2018 10:57 AM     .lstable      8. 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  Stable.  No surgery yet.  No severe pain issues.              ICD-10-CM ICD-9-CM    1. Routine physical examination Z00.00 V70.0    2. 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 Prostate nodule N40.2 600.10 REFERRAL TO UROLOGY   5. Sun-damaged skin L57.8 692.79 REFERRAL TO DERMATOLOGY   6. Essential hypertension I10 401.9    7. Mixed hyperlipidemia E78.2 272.2    8. 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 Routine physical examination    2. Sleep disorder, circadian, shift work type  -     LORazepam (ATIVAN) 0.5 mg tablet; Take 1 Tab by mouth nightly as needed (insomnia). Max Daily Amount: 0.5 mg. Indications: difficulty sleeping    3. Nasal septal deviation  -     REFERRAL TO ENT-OTOLARYNGOLOGY    4. Prostate nodule  -     REFERRAL TO UROLOGY    5. Sun-damaged skin  -     REFERRAL TO DERMATOLOGY    6. Essential hypertension    7. Mixed hyperlipidemia    8. 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 Return in about 1 year (around 06/14/2019).               The patient's abnormal BMI was reviewed and  deemed target BMI for the patient.  An After Visit Summary was provided to the patient and/or caregiver.

## 2018-06-13 NOTE — Progress Notes (Addendum)
Chief Complaint   Patient presents with   ??? Complete Physical       The patient is a 62 y.o. male who is seen for routine checkup.      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  Rt foot fxr while sailing in [place].  About a month ago.  Wearing boot improving.  Small fracture of rt 5th metatarsal head.  No pain.  Xrays reviewed.  Has seen orthopedics.  Patient prefers to not have surgery.      He is tolerating his blood pressure medications and his blood pressure is well controlled.  He exercises on a regular basis and is a non-smoker.  No chest pain shortness of breath or abdominal pain.    He has occasional insomnia with travel that is alleviated by Ambien.  However, his wife is complaining that he thrashes around a lot when taking.  His sleep patterns have been a little more difficult since he is retired now and not traveling as much.  More nasal congestion?  Prior history of septoplasty for deviated septum.  Worsening?    Cholesterol is generally pretty well controlled.    Stress echo in May of 2017 while living in [place].  Notes reviewed.  Negative exam.    Medication refilled    Labs reviewed.      Health Maintenance   Topic Date Due   ??? Shingrix Vaccine Age 10> (1 of 2) 09/19/2004   ??? DTaP/Tdap/Td series (2 - Td) 08/24/2016   ??? Influenza Age 25 to Adult  06/14/2019 (Originally 02/24/2018)   ??? COLONOSCOPY  01/30/2027   ??? Hepatitis C Screening  Completed   ??? Pneumococcal 0-64 years  Aged Out       Patient Active Problem List   Diagnosis Code   ??? HTN (hypertension) I10   ??? Mixed hyperlipidemia E78.2   ??? 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 CAD (coronary artery disease) I25.10   ??? Hemangioma of liver D18.03   ??? 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 Prostate nodule N40.2   ??? 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      Lab Results   Component Value Date/Time    WBC 5.4 05/31/2018 10:57 AM    HGB 13.5 05/31/2018 10:57 AM    HCT 40.0 05/31/2018 10:57 AM     PLATELET 204 05/31/2018 10:57 AM    MCV 88 05/31/2018 10:57 AM       Lab Results   Component Value Date/Time    Glucose 97 05/31/2018 10:57 AM    LDL, calculated 108 (H) 05/31/2018 10:57 AM    Creatinine 1.29 (H) 05/31/2018 10:57 AM      Lab Results   Component Value Date/Time    Cholesterol, total 197 05/31/2018 10:57 AM    HDL Cholesterol 74 05/31/2018 10:57 AM    LDL, calculated 108 (H) 05/31/2018 10:57 AM    Triglyceride 76 05/31/2018 10:57 AM       Lab Results   Component Value Date/Time    ALT (SGPT) 20 05/31/2018 10:57 AM    AST (SGOT) 19 05/31/2018 10:57 AM    Alk. phosphatase 37 (L) 05/31/2018 10:57 AM    Bilirubin, total 1.0 05/31/2018 10:57 AM       Lab Results   Component Value Date/Time    GFR est AA 68 05/31/2018 10:57 AM    GFR est non-AA 59 (L) 05/31/2018 10:57 AM    Creatinine 1.29 (H) 05/31/2018  10:57 AM    BUN 19 05/31/2018 10:57 AM    Sodium 141 05/31/2018 10:57 AM    Potassium 4.6 05/31/2018 10:57 AM    Chloride 102 05/31/2018 10:57 AM    CO2 24 05/31/2018 10:57 AM      Lab Results   Component Value Date/Time    Prostate Specific Ag 3.9 05/31/2018 10:57 AM    Prostate Specific Ag 2.9 03/06/2015 10:48 AM    Prostate Specific Ag 2.6 04/09/2014 10:05 AM     Lab Results   Component Value Date/Time    TSH 1.270 05/31/2018 10:57 AM      Lab Results   Component Value Date/Time    Glucose 97 05/31/2018 10:57 AM         Health Maintenance   Topic Date Due   ??? Shingrix Vaccine Age 72> (1 of 2) 09/19/2004   ??? DTaP/Tdap/Td series (2 - Td) 08/24/2016   ??? Influenza Age 60 to Adult  06/14/2019 (Originally 02/24/2018)   ??? COLONOSCOPY  01/30/2027   ??? Hepatitis C Screening  Completed   ??? Pneumococcal 0-64 years  Aged Out       Past Medical History:   Diagnosis Date   ??? Atopic dermatitis    ??? Heavy metal exposure 03/22/2017   ??? Hypercholesteremia    ??? Hypertension     Controlled with medication    ??? Insomnia        Past Surgical History:   Procedure Laterality Date   ??? COLONOSCOPY N/A 01/29/2017     COLONOSCOPY/ 26 performed by Ernst Breach, MD at [facility]   ??? HX COLONOSCOPY  2006    benign   ??? HX HERNIA REPAIR  1979       Current Outpatient Medications   Medication Sig   ??? LORazepam (ATIVAN) 0.5 mg tablet Take 1 Tab by mouth nightly as needed (insomnia). Max Daily Amount: 0.5 mg. Indications: difficulty sleeping   ??? amLODIPine-benazepril (LOTREL) 5-40 mg per capsule TAKE 1 CAPSULE DAILY   ??? rosuvastatin (CRESTOR) 5 mg tablet Take 1 Tab by mouth nightly. Indications: excessive fat in the blood, primary prevention of heart attack   ??? mometasone (ELOCON) 0.1 % topical cream Apply  to affected area two (2) times a day. Indications: Atopic Dermatitis   ??? zolpidem (AMBIEN) 10 mg tablet Take 1 Tab by mouth nightly as needed for Sleep.   ??? varicella-zoster recombinant, PF, (SHINGRIX, PF,) 50 mcg/0.5 mL susr injection IM: 0.5 mL administered as a 2-dose series at 0 and 2 to 6 months later   ??? acetaminophen (TYLENOL) 325 mg tablet Take  by mouth every four (4) hours as needed for Pain.   ??? ibuprofen (ADVIL) 200 mg tablet Take  by mouth.   ??? b complex vitamins tablet Take 1 tablet by mouth daily.   ??? ascorbic acid (VITAMIN C) 500 mg tablet Take 1,000 mg by mouth daily.   ??? cholecalciferol, vitamin D3, (VITAMIN D3) 2,000 unit tab Take  by mouth.   ??? Glucosam-Chon-Collag-Hyalur Ac 375-300-50-2 mg cap Take  by mouth daily.   ??? coenzyme q10-vitamin e (COQ10 SG 100) 100-100 mg-unit cap Take  by mouth.     No current facility-administered medications for this visit.        Social History     Socioeconomic History   ??? Marital status: MARRIED     Spouse name: Not on file   ??? Number of children: Not on file   ??? Years of  education: Not on file   ??? Highest education level: Not on file   Tobacco Use   ??? Smoking status: Never Smoker   ??? Smokeless tobacco: Never Used   Substance and Sexual Activity   ??? Alcohol use: Yes     Comment: socially   ??? Drug use: No   ??? Sexual activity: Yes     Partners: Female       Family History    Problem Relation Age of Onset   ??? Cancer Mother         breast and Leukemia   ??? Heart Disease Father         CAD   ??? Hypertension Father    ??? Cancer Father         Prostate       No Known Allergies    Results for orders placed or performed in visit on 05/31/18   HEPATITIS C AB   Result Value Ref Range    Hep C Virus Ab <0.1 0.0 - 0.9 s/co ratio   URINALYSIS W/ RFLX MICROSCOPIC   Result Value Ref Range    Specific Gravity 1.007 1.005 - 1.030    pH (UA) 6.5 5.0 - 7.5    Color Yellow Yellow    Appearance Clear Clear    Leukocyte Esterase Negative Negative    Protein Negative Negative/Trace    Glucose Negative Negative    Ketone Negative Negative    Blood Negative Negative    Bilirubin Negative Negative    Urobilinogen 0.2 0.2 - 1.0 mg/dL    Nitrites Negative Negative    Microscopic Examination Comment    TSH 3RD GENERATION   Result Value Ref Range    TSH 1.270 0.450 - 4.500 uIU/mL   PSA, DIAGNOSTIC (PROSTATE SPECIFIC AG)   Result Value Ref Range    Prostate Specific Ag 3.9 0.0 - 4.0 ng/mL   LIPID PANEL   Result Value Ref Range    Cholesterol, total 197 100 - 199 mg/dL    Triglyceride 76 0 - 149 mg/dL    HDL Cholesterol 74 >39 mg/dL    VLDL, calculated 15 5 - 40 mg/dL    LDL, calculated 108 (H) 0 - 99 mg/dL   METABOLIC PANEL, COMPREHENSIVE   Result Value Ref Range    Glucose 97 65 - 99 mg/dL    BUN 19 8 - 27 mg/dL    Creatinine 1.29 (H) 0.76 - 1.27 mg/dL    GFR est non-AA 59 (L) >59 mL/min/1.73    GFR est AA 68 >59 mL/min/1.73    BUN/Creatinine ratio 15 10 - 24    Sodium 141 134 - 144 mmol/L    Potassium 4.6 3.5 - 5.2 mmol/L    Chloride 102 96 - 106 mmol/L    CO2 24 20 - 29 mmol/L    Calcium 9.6 8.6 - 10.2 mg/dL    Protein, total 6.9 6.0 - 8.5 g/dL    Albumin 4.5 3.6 - 4.8 g/dL    GLOBULIN, TOTAL 2.4 1.5 - 4.5 g/dL    A-G Ratio 1.9 1.2 - 2.2    Bilirubin, total 1.0 0.0 - 1.2 mg/dL    Alk. phosphatase 37 (L) 39 - 117 IU/L    AST (SGOT) 19 0 - 40 IU/L    ALT (SGPT) 20 0 - 44 IU/L   CBC WITH AUTOMATED DIFF    Result Value Ref Range    WBC 5.4 3.4 - 10.8 x10E3/uL    RBC  4.53 4.14 - 5.80 x10E6/uL    HGB 13.5 13.0 - 17.7 g/dL    HCT 40.0 37.5 - 51.0 %    MCV 88 79 - 97 fL    MCH 29.8 26.6 - 33.0 pg    MCHC 33.8 31.5 - 35.7 g/dL    RDW 12.3 12.3 - 15.4 %    PLATELET 204 150 - 450 x10E3/uL    NEUTROPHILS 65 Not Estab. %    Lymphocytes 23 Not Estab. %    MONOCYTES 10 Not Estab. %    EOSINOPHILS 1 Not Estab. %    BASOPHILS 1 Not Estab. %    ABS. NEUTROPHILS 3.5 1.4 - 7.0 x10E3/uL    Abs Lymphocytes 1.3 0.7 - 3.1 x10E3/uL    ABS. MONOCYTES 0.5 0.1 - 0.9 x10E3/uL    ABS. EOSINOPHILS 0.1 0.0 - 0.4 x10E3/uL    ABS. BASOPHILS 0.0 0.0 - 0.2 x10E3/uL    IMMATURE GRANULOCYTES 0 Not Estab. %    ABS. IMM. GRANS. 0.0 0.0 - 0.1 x10E3/uL   CVD REPORT   Result Value Ref Range    INTERPRETATION Note     PDF IMAGE Not applicable    CKD REPORT   Result Value Ref Range    Interpretation Note        Review of Systems   Constitutional: Negative.    HENT: Negative.  Negative for congestion, facial swelling, hearing loss, mouth sores, nosebleeds, sore throat, tinnitus and trouble swallowing.    Eyes: Positive for visual disturbance.   Respiratory: Negative.    Cardiovascular: Negative.    Gastrointestinal: Negative.  Negative for blood in stool, constipation, diarrhea and nausea.   Genitourinary: Positive for frequency. Negative for hematuria.   Musculoskeletal: Negative for arthralgias and neck stiffness.   Skin: Positive for color change.   Neurological: Negative.    Psychiatric/Behavioral: Negative.  Negative for sleep disturbance.       Visit Vitals  BP 130/76 (BP 1 Location: Left arm, BP Patient Position: Sitting)   Pulse 96   Temp 98.7 ??F (37.1 ??C) (Oral)   Ht 5' 6" (1.676 m)   Wt 159 lb 3.2 oz (72.2 kg)   SpO2 96%   BMI 25.70 kg/m??     Wt Readings from Last 3 Encounters:   06/13/18 159 lb 3.2 oz (72.2 kg)   06/10/17 167 lb (75.8 kg)   01/29/17 163 lb 6.4 oz (74.1 kg)     BP Readings from Last 3 Encounters:   06/13/18 130/76    06/10/17 128/72   01/29/17 121/73         Physical Exam   Constitutional: He is oriented to person, place, and time. He appears well-developed and well-nourished.   HENT:   Head: Normocephalic and atraumatic.   Eyes: Pupils are equal, round, and reactive to light. No scleral icterus.   Neck: Neck supple.   Cardiovascular: Normal rate, regular rhythm and normal heart sounds.   No murmur heard.  Pulmonary/Chest: Effort normal and breath sounds normal.   Abdominal: Hernia confirmed negative in the right inguinal area and confirmed negative in the left inguinal area.   Genitourinary: Testes normal and penis normal. Rectal exam shows external hemorrhoid. Rectal exam shows no mass and no tenderness. Prostate is not enlarged and not tender.   Musculoskeletal: He exhibits no edema.   Lymphadenopathy: No inguinal adenopathy noted on the right or left side.   Neurological: He is alert and oriented to person, place, and time.   Skin:  Skin is warm and dry. No pallor.   Psychiatric: He has a normal mood and affect. His behavior is normal.   Nursing note and vitals reviewed.      Assessment and Plan     1. Routine physical examination   This patient presents for routine wellness examination and state of health at this time.  Healthy lifestyle, diet, and exercise routines are discussed.  Routine health maintenance issues are reviewed and discussed and ordered and/or completed as appropriate.  Routine labs are reviewed and/or ordered as appropriate.      2. Sleep disorder, circadian, shift work type     - LORazepam (ATIVAN) 0.5 mg tablet; Take 1 Tab by mouth nightly as needed (insomnia). Max Daily Amount: 0.5 mg. Indications: difficulty sleeping  Dispense: 30 Tab; Refill: 0    3. Nasal septal deviation  Refer to Dr. Clement Husbands to examine.      - REFERRAL TO ENT-OTOLARYNGOLOGY    4. Prostate nodule  Lab Results   Component Value Date/Time    Prostate Specific Ag 3.9 05/31/2018 10:57 AM    Prostate Specific Ag 2.9 03/06/2015 10:48 AM     Prostate Specific Ag 2.6 04/09/2014 10:05 AM     Rt prostate nodule.     - REFERRAL TO UROLOGY    5. Sun-damaged skin  Routine derm exam.    - REFERRAL TO DERMATOLOGY    6. Essential hypertension  BP Readings from Last 3 Encounters:   06/13/18 130/76   06/10/17 128/72   01/29/17 121/73     Stable on current management.  No change in medications      7. Mixed hyperlipidemia  Lab Results   Component Value Date/Time    LDL, calculated 108 (H) 05/31/2018 10:57 AM     .lstable      8. 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  Stable.  No surgery yet.  No severe pain issues.              ICD-10-CM ICD-9-CM    1. Routine physical examination Z00.00 V70.0    2. 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 Prostate nodule N40.2 600.10 REFERRAL TO UROLOGY   5. Sun-damaged skin L57.8 692.79 REFERRAL TO DERMATOLOGY   6. Essential hypertension I10 401.9    7. Mixed hyperlipidemia E78.2 272.2    8. 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 Routine physical examination    2. Sleep disorder, circadian, shift work type  -     LORazepam (ATIVAN) 0.5 mg tablet; Take 1 Tab by mouth nightly as needed (insomnia). Max Daily Amount: 0.5 mg. Indications: difficulty sleeping    3. Nasal septal deviation  -     REFERRAL TO ENT-OTOLARYNGOLOGY    4. Prostate nodule  -     REFERRAL TO UROLOGY    5. Sun-damaged skin  -     REFERRAL TO DERMATOLOGY    6. Essential hypertension    7. Mixed hyperlipidemia    8. 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 Return in about 1 year (around 06/14/2019).               The patient's abnormal BMI was reviewed and  deemed target BMI for the patient.  An After Visit Summary was provided to the patient and/or caregiver.

## 2018-07-01 ENCOUNTER — Ambulatory Visit: Attending: Otolaryngology | Primary: Internal Medicine

## 2018-07-01 ENCOUNTER — Ambulatory Visit
Admit: 2018-07-01 | Discharge: 2018-07-01 | Payer: PRIVATE HEALTH INSURANCE | Attending: Otolaryngology | Primary: Internal Medicine

## 2018-07-01 DIAGNOSIS — J3489 Other specified disorders of nose and nasal sinuses: Secondary | ICD-10-CM

## 2018-07-01 NOTE — Progress Notes (Signed)
HPI:  Willie Stewart is a 62 y.o. male seen today in initial consultation for Nasal Congestion (Patient is here today request of Rosana Hoes, MD for problems breathing through nose.Marland Kitchen He had a previous Septoplasty done in 2007/2008 in [place].).  The patient presents for evaluation and treatment of chronic nasal obstruction which has been increasing in severity over the past year or 2.  Of note is that he is status post septoplasty and turbinate reduction about 10 to 12 years ago.  The symptoms have been particularly worse over the past fall allergy season.  He has not been evaluated by an allergist.  He does not smoke and is not around any secondhand smoke.  He is currently retired but does spend a fair amount of time traveling.     HPI     Past Medical History, Past Surgical History, Family history, Social History, and Medications were all reviewed with the patient today and updated as necessary.     No Known Allergies  Patient Active Problem List   Diagnosis Code   ??? HTN (hypertension) I10   ??? Mixed hyperlipidemia E78.2   ??? 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 CAD (coronary artery disease) I25.10   ??? Hemangioma of liver D18.03   ??? 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 Prostate nodule N40.2   ??? 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 LORazepam (ATIVAN) 0.5 mg tablet Take 1 Tab by mouth nightly as needed (insomnia). Max Daily Amount: 0.5 mg. Indications: difficulty sleeping   ??? amLODIPine-benazepril (LOTREL) 5-40 mg per capsule TAKE 1 CAPSULE DAILY   ??? rosuvastatin (CRESTOR) 5 mg tablet Take 1 Tab by mouth nightly. Indications: excessive fat in the blood, primary prevention of heart attack   ??? mometasone (ELOCON) 0.1 % topical cream Apply  to affected area two (2) times a day. Indications: Atopic Dermatitis   ??? zolpidem (AMBIEN) 10 mg tablet Take 1 Tab by mouth nightly as needed for Sleep.   ??? varicella-zoster recombinant, PF,  (SHINGRIX, PF,) 50 mcg/0.5 mL susr injection IM: 0.5 mL administered as a 2-dose series at 0 and 2 to 6 months later   ??? acetaminophen (TYLENOL) 325 mg tablet Take  by mouth every four (4) hours as needed for Pain.   ??? ibuprofen (ADVIL) 200 mg tablet Take  by mouth.   ??? b complex vitamins tablet Take 1 tablet by mouth daily.   ??? ascorbic acid (VITAMIN C) 500 mg tablet Take 1,000 mg by mouth daily.   ??? cholecalciferol, vitamin D3, (VITAMIN D3) 2,000 unit tab Take  by mouth.   ??? Glucosam-Chon-Collag-Hyalur Ac 375-300-50-2 mg cap Take  by mouth daily.   ??? coenzyme q10-vitamin e (COQ10 SG 100) 100-100 mg-unit cap Take  by mouth.     No current facility-administered medications for this visit.      Past Medical History:   Diagnosis Date   ??? Atopic dermatitis    ??? Heavy metal exposure 03/22/2017   ??? Hypercholesteremia    ??? Hypertension     Controlled with medication    ??? Insomnia    ??? Sinus problem      Social History     Tobacco Use   ??? Smoking status: Never Smoker   ??? Smokeless tobacco: Never Used   Substance Use Topics   ??? Alcohol use: Yes     Comment: socially  Past Surgical History:   Procedure Laterality Date   ??? COLONOSCOPY N/A 01/29/2017    COLONOSCOPY/ 26 performed by Ernst Breach, MD at [facility]   ??? HX COLONOSCOPY  2006    benign   ??? HX HERNIA REPAIR  1979     Family History   Problem Relation Age of Onset   ??? Cancer Mother         breast and Leukemia   ??? Heart Disease Father         CAD   ??? Hypertension Father    ??? Cancer Father         Prostate        ROS:    Review of Systems   HENT: Positive for sinus pressure. Negative for congestion, ear discharge, ear pain, hearing loss, postnasal drip, tinnitus, trouble swallowing and voice change.    Eyes: Negative for itching.   Respiratory: Negative for cough.    Neurological: Negative for dizziness and headaches.        PHYSICAL EXAM:    Visit Vitals  Pulse 72   Ht 5\' 6"  (1.676 m)   Wt 157 lb (71.2 kg)   SpO2 95%   BMI 25.34 kg/m??       Physical Exam  HENT:       Head: Normocephalic and atraumatic.      Nose:      Comments: There is noted to be significant obstruction of the nares from a significant drooping of the nasal tip and collapse of the lateral alar rims.  Nasolabial angle is close to 45 degrees.  Eyes:      Pupils: Pupils are equal, round, and reactive to light.   Neck:      Musculoskeletal: Normal range of motion and neck supple.      Thyroid: No thyromegaly.      Vascular: No JVD.      Trachea: No tracheal deviation.   Cardiovascular:      Rate and Rhythm: Normal rate and regular rhythm.   Pulmonary:      Effort: Pulmonary effort is normal. No respiratory distress.      Breath sounds: Normal breath sounds.   Musculoskeletal: Normal range of motion.   Skin:     General: Skin is warm and dry.   Neurological:      Mental Status: He is alert and oriented to person, place, and time.      Cranial Nerves: No cranial nerve deficit.   Psychiatric:         Mood and Affect: Mood normal.       Nasal Endoscopy Procedure Note      Indications   A nasal endoscopy will be performed due to nasal obstruction.    Procedure Details   The risks, benefits, complications, treatment options, and expected outcomes were discussed with the patient. The patient concurred with the proposed plan, giving informed consent.    Nasal endoscopy was performed today under topical Rhinocaine anesthesia. Both nasal airways were examined with evaluation of the cartilaginous and bony septum, inferior, middle and superior turbinates, the lateral nasal wall, middle meatus and infundibulum.    Findings  The findings include: Septum is noted to be in the midline with normal turbinates without significant obstruction..                 Patient Response   The patient tolerated the procedure well..            Procedure Impression  Nasal obstruction            ASSESSMENT and PLAN      ICD-10-CM ICD-9-CM    1. Nasal obstruction J34.89 478.19 NASAL ENDOSCOPY,DX       Reassured regarding the fact that the nasal  septum is still in the midline with normal nonobstructing turbinates.  The greatest degree of nasal obstruction appears to be coming from a drooping nasal tip approaching almost a 45 degree nasolabial angle and associated collapse of the alar rims.  Will refer to facial plastics at [facility] for consultation.          Donnie Aho, MD  07/01/2018

## 2018-07-01 NOTE — Progress Notes (Signed)
HPI:  Willie Stewart is a 62 y.o. male seen today in initial consultation for Nasal Congestion (Patient is here today request of Rosana Hoes, MD for problems breathing through nose.Marland Kitchen He had a previous Septoplasty done in 2007/2008 in [place].).  The patient presents for evaluation and treatment of chronic nasal obstruction which has been increasing in severity over the past year or 2.  Of note is that he is status post septoplasty and turbinate reduction about 10 to 12 years ago.  The symptoms have been particularly worse over the past fall allergy season.  He has not been evaluated by an allergist.  He does not smoke and is not around any secondhand smoke.  He is currently retired but does spend a fair amount of time traveling.     HPI     Past Medical History, Past Surgical History, Family history, Social History, and Medications were all reviewed with the patient today and updated as necessary.     No Known Allergies  Patient Active Problem List   Diagnosis Code   ??? HTN (hypertension) I10   ??? Mixed hyperlipidemia E78.2   ??? 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 CAD (coronary artery disease) I25.10   ??? Hemangioma of liver D18.03   ??? 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 Prostate nodule N40.2   ??? 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 LORazepam (ATIVAN) 0.5 mg tablet Take 1 Tab by mouth nightly as needed (insomnia). Max Daily Amount: 0.5 mg. Indications: difficulty sleeping   ??? amLODIPine-benazepril (LOTREL) 5-40 mg per capsule TAKE 1 CAPSULE DAILY   ??? rosuvastatin (CRESTOR) 5 mg tablet Take 1 Tab by mouth nightly. Indications: excessive fat in the blood, primary prevention of heart attack   ??? mometasone (ELOCON) 0.1 % topical cream Apply  to affected area two (2) times a day. Indications: Atopic Dermatitis   ??? zolpidem (AMBIEN) 10 mg tablet Take 1 Tab by mouth nightly as needed for Sleep.    ??? varicella-zoster recombinant, PF, (SHINGRIX, PF,) 50 mcg/0.5 mL susr injection IM: 0.5 mL administered as a 2-dose series at 0 and 2 to 6 months later   ??? acetaminophen (TYLENOL) 325 mg tablet Take  by mouth every four (4) hours as needed for Pain.   ??? ibuprofen (ADVIL) 200 mg tablet Take  by mouth.   ??? b complex vitamins tablet Take 1 tablet by mouth daily.   ??? ascorbic acid (VITAMIN C) 500 mg tablet Take 1,000 mg by mouth daily.   ??? cholecalciferol, vitamin D3, (VITAMIN D3) 2,000 unit tab Take  by mouth.   ??? Glucosam-Chon-Collag-Hyalur Ac 375-300-50-2 mg cap Take  by mouth daily.   ??? coenzyme q10-vitamin e (COQ10 SG 100) 100-100 mg-unit cap Take  by mouth.     No current facility-administered medications for this visit.      Past Medical History:   Diagnosis Date   ??? Atopic dermatitis    ??? Heavy metal exposure 03/22/2017   ??? Hypercholesteremia    ??? Hypertension     Controlled with medication    ??? Insomnia    ??? Sinus problem      Social History     Tobacco Use   ??? Smoking status: Never Smoker   ??? Smokeless tobacco: Never Used   Substance Use Topics   ??? Alcohol use: Yes     Comment: socially  Past Surgical History:   Procedure Laterality Date   ??? COLONOSCOPY N/A 01/29/2017    COLONOSCOPY/ 26 performed by Ernst Breach, MD at [facility]   ??? HX COLONOSCOPY  2006    benign   ??? HX HERNIA REPAIR  1979     Family History   Problem Relation Age of Onset   ??? Cancer Mother         breast and Leukemia   ??? Heart Disease Father         CAD   ??? Hypertension Father    ??? Cancer Father         Prostate        ROS:    Review of Systems   HENT: Positive for sinus pressure. Negative for congestion, ear discharge, ear pain, hearing loss, postnasal drip, tinnitus, trouble swallowing and voice change.    Eyes: Negative for itching.   Respiratory: Negative for cough.    Neurological: Negative for dizziness and headaches.        PHYSICAL EXAM:    Visit Vitals  Pulse 72   Ht 5\' 6"  (1.676 m)   Wt 157 lb (71.2 kg)   SpO2 95%    BMI 25.34 kg/m??       Physical Exam  HENT:      Head: Normocephalic and atraumatic.      Nose:      Comments: There is noted to be significant obstruction of the nares from a significant drooping of the nasal tip and collapse of the lateral alar rims.  Nasolabial angle is close to 45 degrees.  Eyes:      Pupils: Pupils are equal, round, and reactive to light.   Neck:      Musculoskeletal: Normal range of motion and neck supple.      Thyroid: No thyromegaly.      Vascular: No JVD.      Trachea: No tracheal deviation.   Cardiovascular:      Rate and Rhythm: Normal rate and regular rhythm.   Pulmonary:      Effort: Pulmonary effort is normal. No respiratory distress.      Breath sounds: Normal breath sounds.   Musculoskeletal: Normal range of motion.   Skin:     General: Skin is warm and dry.   Neurological:      Mental Status: He is alert and oriented to person, place, and time.      Cranial Nerves: No cranial nerve deficit.   Psychiatric:         Mood and Affect: Mood normal.       Nasal Endoscopy Procedure Note      Indications   A nasal endoscopy will be performed due to nasal obstruction.    Procedure Details   The risks, benefits, complications, treatment options, and expected outcomes were discussed with the patient. The patient concurred with the proposed plan, giving informed consent.    Nasal endoscopy was performed today under topical Rhinocaine anesthesia. Both nasal airways were examined with evaluation of the cartilaginous and bony septum, inferior, middle and superior turbinates, the lateral nasal wall, middle meatus and infundibulum.    Findings  The findings include: Septum is noted to be in the midline with normal turbinates without significant obstruction..                 Patient Response   The patient tolerated the procedure well..            Procedure Impression  Nasal obstruction            ASSESSMENT and PLAN      ICD-10-CM ICD-9-CM    1. Nasal obstruction J34.89 478.19 NASAL ENDOSCOPY,DX        Reassured regarding the fact that the nasal septum is still in the midline with normal nonobstructing turbinates.  The greatest degree of nasal obstruction appears to be coming from a drooping nasal tip approaching almost a 45 degree nasolabial angle and associated collapse of the alar rims.  Will refer to facial plastics at [facility] for consultation.          Donnie Aho, MD  07/01/2018

## 2018-08-04 ENCOUNTER — Ambulatory Visit: Attending: Urology | Primary: Internal Medicine

## 2018-08-04 ENCOUNTER — Ambulatory Visit
Admit: 2018-08-04 | Discharge: 2018-08-04 | Payer: BLUE CROSS/BLUE SHIELD | Attending: Urology | Primary: Internal Medicine

## 2018-08-04 DIAGNOSIS — N138 Other obstructive and reflux uropathy: Secondary | ICD-10-CM

## 2018-08-04 DIAGNOSIS — N403 Nodular prostate with lower urinary tract symptoms: Secondary | ICD-10-CM

## 2018-08-04 LAB — AMB POC URINALYSIS DIP STICK AUTO W/ MICRO (PGU)
Bilirubin (UA POC): NEGATIVE
Bilirubin, Urine, POC: NEGATIVE
Blood (UA POC): NEGATIVE
Blood (UA POC): NEGATIVE
Glucose (UA POC): NEGATIVE mg/dL
Glucose, Urine, POC: NEGATIVE mg/dL
KETONES, Urine, POC: NEGATIVE
Ketones (UA POC): NEGATIVE
Leukocyte Esterase, Urine, POC: NEGATIVE
Leukocyte esterase (UA POC): NEGATIVE
Nitrite, Urine, POC: NEGATIVE
Nitrites (UA POC): NEGATIVE
Protein (UA POC): NEGATIVE
Protein, Urine, POC: NEGATIVE
Specific Gravity, Urine, POC: 1.025 NA (ref 1.001–1.035)
Specific gravity (UA POC): 1.025 (ref 1.001–1.035)
Urobilinogen (POC): 0.2
Urobilinogen, POC: 0.2
pH (UA POC): 5 (ref 4.6–8.0)
pH, Urine, POC: 5 NA (ref 4.6–8.0)

## 2018-08-04 NOTE — Progress Notes (Signed)
Printed for Dr. Rice

## 2018-08-04 NOTE — Progress Notes (Signed)
[facility]  [address] [place] [address]  [phone]    Willie Stewart  DOB: Oct 31, 1955    Chief Complaint   Patient presents with   ??? New Patient     Prostate nodule.  family history of prostate cancer--father   PSA 04/12/2013 = 2.5  PSA 04/09/2014 = 2.6  PSA 03/06/2015 = 2.9  PSA 05/31/2018 = 3.9    Patient's father has a history of prostate cancer patient discovered by his primary care doctor to possibly have a prostatic nodule.  His father is now in his 101s and had seed implants several years ago and has not had more problems prostate cancer his brother that is 5 years younger noticed symptoms they are.  Referred here from his primary care.       HPI     Willie Stewart is a 63 y.o. male          Past Medical History:   Diagnosis Date   ??? Atopic dermatitis    ??? Heavy metal exposure 03/22/2017   ??? Hypercholesteremia    ??? Hypertension     Controlled with medication    ??? Insomnia    ??? Sinus problem      Past Surgical History:   Procedure Laterality Date   ??? COLONOSCOPY N/A 01/29/2017    COLONOSCOPY/ 26 performed by Ernst Breach, MD at [facility]   ??? HX COLONOSCOPY  2006    benign   ??? HX HERNIA REPAIR  1979     Current Outpatient Medications   Medication Sig Dispense Refill   ??? amLODIPine-benazepril (LOTREL) 5-40 mg per capsule TAKE 1 CAPSULE DAILY 90 Cap 3   ??? rosuvastatin (CRESTOR) 5 mg tablet Take 1 Tab by mouth nightly. Indications: excessive fat in the blood, primary prevention of heart attack 90 Tab 3   ??? mometasone (ELOCON) 0.1 % topical cream Apply  to affected area two (2) times a day. Indications: Atopic Dermatitis 45 g 0   ??? zolpidem (AMBIEN) 10 mg tablet Take 1 Tab by mouth nightly as needed for Sleep. 90 Tab 3   ??? acetaminophen (TYLENOL) 325 mg tablet Take  by mouth as needed for Pain.     ??? ibuprofen (ADVIL) 200 mg tablet Take 200 mg by mouth as needed.     ??? b complex vitamins tablet Take 1 tablet by mouth daily.     ??? ascorbic acid (VITAMIN C) 500 mg tablet Take 1,000 mg by mouth  daily.     ??? cholecalciferol, vitamin D3, (VITAMIN D3) 2,000 unit tab Take  by mouth.     ??? Glucosam-Chon-Collag-Hyalur Ac 375-300-50-2 mg cap Take  by mouth daily.     ??? coenzyme q10-vitamin e (COQ10 SG 100) 100-100 mg-unit cap Take  by mouth.       No Known Allergies  Social History     Socioeconomic History   ??? Marital status: MARRIED     Spouse name: Not on file   ??? Number of children: Not on file   ??? Years of education: Not on file   ??? Highest education level: Not on file   Occupational History   ??? Not on file   Social Needs   ??? Financial resource strain: Not on file   ??? Food insecurity:     Worry: Not on file     Inability: Not on file   ??? Transportation needs:     Medical: Not on file  Non-medical: Not on file   Tobacco Use   ??? Smoking status: Never Smoker   ??? Smokeless tobacco: Never Used   Substance and Sexual Activity   ??? Alcohol use: Yes     Comment: socially   ??? Drug use: No   ??? Sexual activity: Yes     Partners: Female   Lifestyle   ??? Physical activity:     Days per week: Not on file     Minutes per session: Not on file   ??? Stress: Not on file   Relationships   ??? Social connections:     Talks on phone: Not on file     Gets together: Not on file     Attends religious service: Not on file     Active member of club or organization: Not on file     Attends meetings of clubs or organizations: Not on file     Relationship status: Not on file   ??? Intimate partner violence:     Fear of current or ex partner: Not on file     Emotionally abused: Not on file     Physically abused: Not on file     Forced sexual activity: Not on file   Other Topics Concern   ??? Not on file   Social History Narrative   ??? Not on file     Family History   Problem Relation Age of Onset   ??? Cancer Mother         breast and Leukemia   ??? Heart Disease Father         CAD   ??? Hypertension Father    ??? Cancer Father         Prostate       Review of Systems  Constitutional:   Negative for fever and headaches.  Skin: Positive for  itching.  ENT: Positive for high frequency hearing loss.  Respiratory:  Negative for shortness of breath.  Cardiovascular:  Negative for chest pain.  GI:  Negative for abdominal pain.  Genitourinary: Positive for nocturia, slower stream, frequent urination and incomplete emptying. Negative for urinary burning and hematuria.  Musculoskeletal:  Negative for back pain.  Neurological:  Negative for numbness.  Psychological:  Negative for depression.  Endocrine:  Negative for fatigue.  Hem/Lymphatic:  Negative for easy bruising.      Urinalysis  UA - Dipstick  Results for orders placed or performed in visit on 08/04/18   AMB POC URINALYSIS DIP STICK AUTO W/ MICRO (PGU)     Status: None   Result Value Ref Range Status    Glucose (UA POC) Negative Negative mg/dL Final    Bilirubin (UA POC) Negative Negative Final    Ketones (UA POC) Negative Negative Final    Specific gravity (UA POC) 1.025 1.001 - 1.035 Final    Blood (UA POC) Negative Negative Final    pH (UA POC) 5.0 4.6 - 8.0 Final    Protein (UA POC) Negative Negative Final    Urobilinogen (POC) 0.2 mg/dL  Final    Nitrites (UA POC) Negative Negative Final    Leukocyte esterase (UA POC) Negative Negative Final       UA - Micro  WBC - 0  RBC - 0  Bacteria - 0  Epith - 0    Physical Exam  Constitutional:       Appearance: Normal appearance.   Cardiovascular:      Rate and Rhythm: Normal rate and regular  rhythm.   Pulmonary:      Effort: Pulmonary effort is normal.   Genitourinary:     Comments: Patient has some external hemorrhoids no rectal masses.  On the prostate the prostate seems average size at the apex is a nodular area but it appears to be in the mucosa as opposed to the prostate but it would be at least 6 to 8 mm in size.  Musculoskeletal: Normal range of motion.   Skin:     General: Skin is warm and dry.   Neurological:      Mental Status: He is alert.           Assessment and Plan    ICD-10-CM ICD-9-CM    1. Prostate nodule with urinary obstruction N40.3  600.11 AMB POC URINALYSIS DIP STICK AUTO W/ MICRO (PGU)    N13.8  PSA, TOTAL &  FREE      PSA, TOTAL &  FREE   Family history of prostate cancer.  Patient has a nodule near the apex of the prostate but it feels more mucosal.  I recommend repeat a free and total PSA today and based on the lab test we will see if we need to do a biopsy.  We will also repeat a free and total PSA in 3 months and follow-up    Orders Placed This Encounter   ??? PSA, TOTAL &  FREE     Standing Status:   Future     Standing Expiration Date:   02/01/2020   ??? PSA, TOTAL &  FREE     Standing Status:   Future     Standing Expiration Date:   02/01/2020   ??? AMB POC URINALYSIS DIP STICK AUTO W/ MICRO (PGU)       Follow-up and Dispositions    ?? Return in about 3 months (around 11/03/2018) for to review PSA II and exam.         More than 50% of this 20 minute visit was spent counseling the patient about plan to follow little closer    Elements of this note have been dictated using speech recognition software.  Although reviewed, errors of speech recognition may have occurred.

## 2018-08-04 NOTE — Progress Notes (Signed)
[facility]  [address] [place] [address]  [phone]    Willie Stewart  DOB: Feb 26, 1956    Chief Complaint   Patient presents with   ??? New Patient     Prostate nodule.  family history of prostate cancer--father   PSA 04/12/2013 = 2.5  PSA 04/09/2014 = 2.6  PSA 03/06/2015 = 2.9  PSA 05/31/2018 = 3.9    Patient's father has a history of prostate cancer patient discovered by his primary care doctor to possibly have a prostatic nodule.  His father is now in his 45s and had seed implants several years ago and has not had more problems prostate cancer his brother that is 5 years younger noticed symptoms they are.  Referred here from his primary care.       HPI     Willie Stewart is a 63 y.o. male          Past Medical History:   Diagnosis Date   ??? Atopic dermatitis    ??? Heavy metal exposure 03/22/2017   ??? Hypercholesteremia    ??? Hypertension     Controlled with medication    ??? Insomnia    ??? Sinus problem      Past Surgical History:   Procedure Laterality Date   ??? COLONOSCOPY N/A 01/29/2017    COLONOSCOPY/ 26 performed by Ernst Breach, MD at [facility]   ??? HX COLONOSCOPY  2006    benign   ??? HX HERNIA REPAIR  1979     Current Outpatient Medications   Medication Sig Dispense Refill   ??? amLODIPine-benazepril (LOTREL) 5-40 mg per capsule TAKE 1 CAPSULE DAILY 90 Cap 3   ??? rosuvastatin (CRESTOR) 5 mg tablet Take 1 Tab by mouth nightly. Indications: excessive fat in the blood, primary prevention of heart attack 90 Tab 3   ??? mometasone (ELOCON) 0.1 % topical cream Apply  to affected area two (2) times a day. Indications: Atopic Dermatitis 45 g 0   ??? zolpidem (AMBIEN) 10 mg tablet Take 1 Tab by mouth nightly as needed for Sleep. 90 Tab 3   ??? acetaminophen (TYLENOL) 325 mg tablet Take  by mouth as needed for Pain.     ??? ibuprofen (ADVIL) 200 mg tablet Take 200 mg by mouth as needed.     ??? b complex vitamins tablet Take 1 tablet by mouth daily.      ??? ascorbic acid (VITAMIN C) 500 mg tablet Take 1,000 mg by mouth daily.     ??? cholecalciferol, vitamin D3, (VITAMIN D3) 2,000 unit tab Take  by mouth.     ??? Glucosam-Chon-Collag-Hyalur Ac 375-300-50-2 mg cap Take  by mouth daily.     ??? coenzyme q10-vitamin e (COQ10 SG 100) 100-100 mg-unit cap Take  by mouth.       No Known Allergies  Social History     Socioeconomic History   ??? Marital status: MARRIED     Spouse name: Not on file   ??? Number of children: Not on file   ??? Years of education: Not on file   ??? Highest education level: Not on file   Occupational History   ??? Not on file   Social Needs   ??? Financial resource strain: Not on file   ??? Food insecurity:     Worry: Not on file     Inability: Not on file   ??? Transportation needs:     Medical: Not on file  Non-medical: Not on file   Tobacco Use   ??? Smoking status: Never Smoker   ??? Smokeless tobacco: Never Used   Substance and Sexual Activity   ??? Alcohol use: Yes     Comment: socially   ??? Drug use: No   ??? Sexual activity: Yes     Partners: Female   Lifestyle   ??? Physical activity:     Days per week: Not on file     Minutes per session: Not on file   ??? Stress: Not on file   Relationships   ??? Social connections:     Talks on phone: Not on file     Gets together: Not on file     Attends religious service: Not on file     Active member of club or organization: Not on file     Attends meetings of clubs or organizations: Not on file     Relationship status: Not on file   ??? Intimate partner violence:     Fear of current or ex partner: Not on file     Emotionally abused: Not on file     Physically abused: Not on file     Forced sexual activity: Not on file   Other Topics Concern   ??? Not on file   Social History Narrative   ??? Not on file     Family History   Problem Relation Age of Onset   ??? Cancer Mother         breast and Leukemia   ??? Heart Disease Father         CAD   ??? Hypertension Father    ??? Cancer Father         Prostate       Review of Systems   Constitutional:   Negative for fever and headaches.  Skin: Positive for itching.  ENT: Positive for high frequency hearing loss.  Respiratory:  Negative for shortness of breath.  Cardiovascular:  Negative for chest pain.  GI:  Negative for abdominal pain.  Genitourinary: Positive for nocturia, slower stream, frequent urination and incomplete emptying. Negative for urinary burning and hematuria.  Musculoskeletal:  Negative for back pain.  Neurological:  Negative for numbness.  Psychological:  Negative for depression.  Endocrine:  Negative for fatigue.  Hem/Lymphatic:  Negative for easy bruising.      Urinalysis  UA - Dipstick  Results for orders placed or performed in visit on 08/04/18   AMB POC URINALYSIS DIP STICK AUTO W/ MICRO (PGU)     Status: None   Result Value Ref Range Status    Glucose (UA POC) Negative Negative mg/dL Final    Bilirubin (UA POC) Negative Negative Final    Ketones (UA POC) Negative Negative Final    Specific gravity (UA POC) 1.025 1.001 - 1.035 Final    Blood (UA POC) Negative Negative Final    pH (UA POC) 5.0 4.6 - 8.0 Final    Protein (UA POC) Negative Negative Final    Urobilinogen (POC) 0.2 mg/dL  Final    Nitrites (UA POC) Negative Negative Final    Leukocyte esterase (UA POC) Negative Negative Final       UA - Micro  WBC - 0  RBC - 0  Bacteria - 0  Epith - 0    Physical Exam  Constitutional:       Appearance: Normal appearance.   Cardiovascular:      Rate and Rhythm: Normal rate and regular  rhythm.   Pulmonary:      Effort: Pulmonary effort is normal.   Genitourinary:     Comments: Patient has some external hemorrhoids no rectal masses.  On the prostate the prostate seems average size at the apex is a nodular area but it appears to be in the mucosa as opposed to the prostate but it would be at least 6 to 8 mm in size.  Musculoskeletal: Normal range of motion.   Skin:     General: Skin is warm and dry.   Neurological:      Mental Status: He is alert.           Assessment and Plan     ICD-10-CM ICD-9-CM    1. Prostate nodule with urinary obstruction N40.3 600.11 AMB POC URINALYSIS DIP STICK AUTO W/ MICRO (PGU)    N13.8  PSA, TOTAL &  FREE      PSA, TOTAL &  FREE   Family history of prostate cancer.  Patient has a nodule near the apex of the prostate but it feels more mucosal.  I recommend repeat a free and total PSA today and based on the lab test we will see if we need to do a biopsy.  We will also repeat a free and total PSA in 3 months and follow-up    Orders Placed This Encounter   ??? PSA, TOTAL &  FREE     Standing Status:   Future     Standing Expiration Date:   02/01/2020   ??? PSA, TOTAL &  FREE     Standing Status:   Future     Standing Expiration Date:   02/01/2020   ??? AMB POC URINALYSIS DIP STICK AUTO W/ MICRO (PGU)       Follow-up and Dispositions    ?? Return in about 3 months (around 11/03/2018) for to review PSA II and exam.         More than 50% of this 20 minute visit was spent counseling the patient about plan to follow little closer    Elements of this note have been dictated using speech recognition software.  Although reviewed, errors of speech recognition may have occurred.

## 2018-08-05 LAB — PSA, TOTAL &  FREE
PSA, % Free: 19.1 %
PSA, Free: 0.9 ng/mL
Prostate Specific Ag: 4.7 ng/mL — ABNORMAL HIGH (ref 0.0–4.0)

## 2018-08-05 LAB — PSA, TOTAL AND FREE
PSA, Free Pct: 19.1 %
PSA, Free: 0.9 ng/mL
PSA: 4.7 ng/mL — ABNORMAL HIGH (ref 0.0–4.0)

## 2018-08-15 NOTE — Progress Notes (Signed)
Patient's PSA about 2 months ago was 3.9 and then we repeated it as a free and total PSA.  It came back  PSA 08/05/2018 = 4.7, percent free 19.1% free giving him a 17% risk of prostate cancer which is average.    I talked him on the phone I recommend keep his appointment he was to have another repeat free and total PSA in about 3 months.    He did have a prostate nodule but I thought it was outside of the prostate possibly submucosally near the apex.  No need for biopsy right at this point he seems to be comfortable with that.  I discussed it with him on the phone.

## 2018-08-17 ENCOUNTER — Encounter

## 2018-08-17 MED ORDER — AMLODIPINE-BENAZEPRIL 5 MG-40 MG CAP
5-40 mg | ORAL_CAPSULE | Freq: Every day | ORAL | 3 refills | Status: DC
Start: 2018-08-17 — End: 2018-09-23

## 2018-08-17 MED ORDER — ROSUVASTATIN 5 MG TAB
5 mg | ORAL_TABLET | Freq: Every evening | ORAL | 3 refills | Status: DC
Start: 2018-08-17 — End: 2019-05-22

## 2018-08-17 NOTE — Telephone Encounter (Signed)
Pt needs a refill on Rosuvastatin. Thank you.

## 2018-08-17 NOTE — Telephone Encounter (Signed)
Pt needs a refill on Amlodipine-Benazepril sent to [facility].  Thank you.

## 2018-08-17 NOTE — Telephone Encounter (Signed)
Pt notified via [place].Marland Kitchen

## 2018-09-23 ENCOUNTER — Encounter

## 2018-09-26 MED ORDER — AMLODIPINE-BENAZEPRIL 5 MG-40 MG CAP
5-40 mg | ORAL_CAPSULE | Freq: Every day | ORAL | 3 refills | Status: DC
Start: 2018-09-26 — End: 2019-05-22

## 2018-09-26 NOTE — Telephone Encounter (Signed)
Patient requesting refills

## 2018-09-30 ENCOUNTER — Encounter

## 2018-09-30 NOTE — Progress Notes (Signed)
This patient is being evaluated for rhinoplasty and septoplasty and was seen and evaluated at [facility].  Prior to surgical intervention ENT and reconstructive maxillofacial surgery recommended a preoperative evaluation for sleep apnea.

## 2018-09-30 NOTE — Progress Notes (Signed)
This patient is being evaluated for rhinoplasty and septoplasty and was seen and evaluated at [facility].  Prior to surgical intervention ENT and reconstructive maxillofacial surgery recommended a preoperative evaluation for sleep apnea.

## 2018-11-03 ENCOUNTER — Encounter: Primary: Internal Medicine

## 2018-11-07 ENCOUNTER — Encounter: Primary: Internal Medicine

## 2018-11-09 NOTE — Telephone Encounter (Signed)
Pt states he was told by Lattie Haw on Monday that order for PSA was faxed to [facility] on [address]. Lab does not have order. Please fax again and call patient once it's completed.

## 2018-11-10 NOTE — Telephone Encounter (Signed)
Patient is calling back again for status of Lab order being sent to [facility] on [address]

## 2018-11-14 ENCOUNTER — Encounter: Attending: Urology | Primary: Internal Medicine

## 2018-11-15 ENCOUNTER — Other Ambulatory Visit: Admit: 2018-11-15 | Discharge: 2018-11-15 | Payer: BLUE CROSS/BLUE SHIELD | Primary: Internal Medicine

## 2018-11-15 DIAGNOSIS — N138 Other obstructive and reflux uropathy: Secondary | ICD-10-CM

## 2018-11-15 NOTE — Progress Notes (Signed)
Dr. Benjamine Mola reviewed.

## 2018-11-16 ENCOUNTER — Telehealth: Primary: Internal Medicine

## 2018-11-16 ENCOUNTER — Telehealth: Admit: 2018-11-16 | Discharge: 2018-11-16 | Payer: BLUE CROSS/BLUE SHIELD | Primary: Internal Medicine

## 2018-11-16 DIAGNOSIS — R0683 Snoring: Secondary | ICD-10-CM

## 2018-11-16 LAB — PSA, TOTAL &  FREE
PSA, % Free: 12.8 %
PSA, Free: 0.6 ng/mL
Prostate Specific Ag: 4.7 ng/mL — ABNORMAL HIGH (ref 0.0–4.0)

## 2018-11-16 LAB — PSA, TOTAL AND FREE
PSA, Free Pct: 12.8 %
PSA, Free: 0.6 ng/mL
PSA: 4.7 ng/mL — ABNORMAL HIGH (ref 0.0–4.0)

## 2018-11-16 MED ORDER — ESZOPICLONE 2 MG TAB
2 mg | ORAL_TABLET | Freq: Every evening | ORAL | 0 refills | Status: AC
Start: 2018-11-16 — End: ?

## 2018-11-16 NOTE — Progress Notes (Signed)
The patient is seen by synchronous (real-time) audio-video technology on [employer]. Visit duration 25 mins and 31 secs.      Consent:  The patient/healthcare decision maker is aware that this patient-initiated Telehealth encounter is a billable service, with coverage as determined by his insurance carrier. The patient is aware that he/she may receive a bill and has provided verbal consent to proceed: Yes     I was at the office working remotely while conducting this visit.    We discussed the expected course, resolution and complications of the diagnosis(es) in detail.  Medication risks, benefits, costs, interactions, and alternatives were discussed as indicated.  I advised him to contact the office if his condition worsens, changes or fails to improve as anticipated. He expressed understanding with the diagnosis(es) and plan.     Pursuant to the emergency declaration under the [place], 1135 waiver authority and the [employer] Act, this Virtual  Visit was conducted, with patient's consent, to reduce the patient's risk of exposure to COVID-19 and provide continuity of care for an established patient.     Services were provided through a video synchronous discussion virtually to substitute for in-person clinic visit.     [facility]  [address] [place] [address]  [phone]  Patient Name:  Willie Stewart  Date of Birth:  Jan 03, 1956      Office Visit 11/16/2018    CHIEF COMPLAINT:    Chief Complaint   Patient presents with   ??? New Patient   ??? Snoring   ??? 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 He has had persistent nasal obstruction which has progressively gotten worse. Pt was referred to ENT in [place] area and then referred to ENT at [facility]. He was seen  by Dr. Rudene Re on 09/06/2018. He had significant symptoms of hypersomnolence and snoring. Pt was referred for further evaluation prior to being considered for sinus surgery.  Pt reports that he has had trouble breathing for years. It did improve after his sinus surgery but only lasted for about one year or so. His symptoms have progressively gotten worse. He can't breath out of his nose.   He snores and his wife has told him that he stops breathing. He has woken up feeling short of breath. He gets up at least 3 times per night to use the restroom. Pt does drink water all day long and knows that this contributes.   He does complain of issues with frequent leg movement when he first gets into bed. He reports that at times he can fall asleep immediately and other times, it can take 5 hours to fall asleep. He was on Ambien for a long time but started having issues with awaking feeling mad. He stopped the [place] and the side effects resolved.   Pt admits to drinking 3 cups of coffee per day. He doesn't drink any coffee after 2 pm. He may have one glass of iced tea in the afternoon but typically drinks only water in the afternoon and evening. Pt goes to bed around 10-11pm and can fall asleep immediately or it may take 5 hours to fall asleep. He usually gets up around 8am.   Pt admits  to being tired all of the time. He awakes feeling tired. He will try to take a few "cat naps" throughout the day. He usually rests for about 5-10 mins. They are helpful but don't energize him for the day. He denies falling asleep driving or watching tiv. He doesn't fall asleep unless he intends to. We discussed OSA and sleep study. Pt is agreeable to an in lab sleep study. He would like to try something to help him sleep during the sleep study but is hesitant.       Past Medical History:   Diagnosis Date   ??? Atopic dermatitis    ??? Heavy metal exposure 03/22/2017   ??? Hypercholesteremia    ??? Hypertension     Controlled with medication    ???  Insomnia    ??? 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 COLONOSCOPY N/A 01/29/2017    COLONOSCOPY/ 26 performed by Ernst Breach, MD at [place]   ??? HX COLONOSCOPY  2006    benign   ??? HX HERNIA REPAIR  1979         Social History     Socioeconomic History   ??? Marital status: MARRIED     Spouse name: Not on file   ??? Number of children: Not on file   ??? Years of education: Not on file   ??? Highest education level: Not on file   Occupational History   ??? Not on file   Social Needs   ??? Financial resource strain: Not on file   ??? Food insecurity     Worry: Not on file     Inability: Not on file   ??? Transportation needs     Medical: Not on file     Non-medical: Not on file   Tobacco Use   ??? Smoking status: Never Smoker   ??? Smokeless tobacco: Never Used   Substance and Sexual Activity   ??? Alcohol use: Yes     Comment: socially   ??? Drug use: No   ??? Sexual activity: Yes     Partners: Female   Lifestyle   ??? Physical activity     Days per week: Not on file     Minutes per session: Not on file   ??? Stress: Not on file   Relationships   ??? Social Product manager on phone: Not on file  Gets together: Not on file     Attends religious service: Not on file     Active member of club  or organization: Not on file     Attends meetings of clubs or organizations: Not on file     Relationship status: Not on file   ??? Intimate partner violence     Fear of current or ex partner: Not on file     Emotionally abused: Not on file     Physically abused: Not on file     Forced sexual activity: Not on file   Other Topics Concern   ??? Not on file   Social History Narrative   ??? Not on file         Family History   Problem Relation Age of Onset   ??? Cancer Mother         breast and Leukemia   ??? Heart Disease Father         CAD   ??? Hypertension Father    ??? Cancer Father         Prostate         No Known Allergies      Current Outpatient Medications   Medication Sig   ??? eszopiclone (LUNESTA) 2 mg tablet Take 1 Tab by mouth nightly. Max Daily Amount: 2 mg.   ??? amLODIPine-benazepril (LOTREL) 5-40 mg per capsule Take 1 Cap by mouth daily.   ??? rosuvastatin (CRESTOR) 5 mg tablet Take 1 Tab by mouth nightly. Indications: excessive fat in the blood, primary prevention of heart attack   ??? acetaminophen (TYLENOL) 325 mg tablet Take  by mouth as needed for Pain.   ??? ascorbic acid (VITAMIN C) 500 mg tablet Take 1,000 mg by mouth daily.   ??? cholecalciferol, vitamin D3, (VITAMIN D3) 2,000 unit tab Take  by mouth.   ??? Glucosam-Chon-Collag-Hyalur Ac 375-300-50-2 mg cap Take  by mouth daily.   ??? coenzyme q10-vitamin e (COQ10 SG 100) 100-100 mg-unit cap Take  by mouth.   ??? mometasone (ELOCON) 0.1 % topical cream Apply  to affected area two (2) times a day. Indications: Atopic Dermatitis   ??? zolpidem (AMBIEN) 10 mg tablet Take 1 Tab by mouth nightly as needed for Sleep.   ??? ibuprofen (ADVIL) 200 mg tablet Take 200 mg by mouth as needed.     No current facility-administered medications for this visit.      Review of Systems   Constitutional: Positive for malaise/fatigue.   HENT:        Sinus blockage, congestion   Eyes: Negative.    Respiratory: Negative.    Cardiovascular: Negative.    Gastrointestinal: Negative.    Genitourinary:  Positive for frequency.   Musculoskeletal: Positive for joint pain.   Skin: Negative.    Neurological: Negative.    Endo/Heme/Allergies: Negative.    Psychiatric/Behavioral: The patient has insomnia.              PHYSICAL EXAM:    There were no vitals taken for this visit.     GENERAL APPEARANCE:   The patient is normal weight and in no respiratory distress, on RA.   HEENT:    Conjunctivae unremarkable.   LUNGS:   Normal respiratory effort with symmetrical lung expansion.    NEURO:   The patient is alert and oriented to person, place, and time.  Memory appears intact and mood is normal.  No gross sensorimotor deficits are present.  ASSESSMENT:  (Medical Decision Making)       ICD-10-CM ICD-9-CM    1. Snoring-The pathophysiology of obstructive sleep apnea was reviewed with the patient.  It's potential to promote severe neurologic, cardiac, pulmonary, and gastrointestinal problems was discussed. Specifically, the increased incidence of hypertension, coronary artery disease, congestive heart failure, pulmonary hypertension, gastroesophageal reflux, pathologic hypersomnolence, memory loss, and glucose intolerance was related to the consequences of hypoxemia, hypercapnia, airway obstruction, and sympathetic overdrive.  We also discussed the ability of nasal CPAP to correct these abnormalities through maintenance of a patent airway.   Check split night PSG  R06.83 786.09 REFERRAL TO SLEEP STUDIES   2. Hypersomnia-check split night PSG G47.10 780.54 REFERRAL TO SLEEP STUDIES      eszopiclone (LUNESTA) 2 mg tablet   3. Periodic limb movement-evaluate for RLS G47.61 327.51 REFERRAL TO SLEEP STUDIES      eszopiclone (LUNESTA) 2 mg tablet        PLAN:  Check split night PSG  Lunesta Rx sent to his pharmacy to use the night of his sleep study     Follow up in the sleep center in 3 months after PSG    On site physician: Dr. Enos Fling, PA  Electronically signed

## 2018-11-16 NOTE — Progress Notes (Signed)
The patient is seen by synchronous (real-time) audio-video technology on [employer]. Visit duration 25 mins and 31 secs.      Consent:  The patient/healthcare decision maker is aware that this patient-initiated Telehealth encounter is a billable service, with coverage as determined by his insurance carrier. The patient is aware that he/she may receive a bill and has provided verbal consent to proceed: Yes     I was at the office working remotely while conducting this visit.    We discussed the expected course, resolution and complications of the diagnosis(es) in detail.  Medication risks, benefits, costs, interactions, and alternatives were discussed as indicated.  I advised him to contact the office if his condition worsens, changes or fails to improve as anticipated. He expressed understanding with the diagnosis(es) and plan.     Pursuant to the emergency declaration under the [place], 1135 waiver authority and the [employer] Act, this Virtual  Visit was conducted, with patient's consent, to reduce the patient's risk of exposure to COVID-19 and provide continuity of care for an established patient.     Services were provided through a video synchronous discussion virtually to substitute for in-person clinic visit.     [facility]  [address] [place] [address]  [phone]  Patient Name:  Willie Stewart  Date of Birth:  1956/04/26      Office Visit 11/16/2018    CHIEF COMPLAINT:    Chief Complaint   Patient presents with   ??? New Patient   ??? Snoring   ??? 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 He has had persistent nasal obstruction which has progressively gotten worse. Pt was referred to ENT in [place] area and then referred to ENT at [facility]. He  was seen by Dr. Rudene Re on 09/06/2018. He had significant symptoms of hypersomnolence and snoring. Pt was referred for further evaluation prior to being considered for sinus surgery.  Pt reports that he has had trouble breathing for years. It did improve after his sinus surgery but only lasted for about one year or so. His symptoms have progressively gotten worse. He can't breath out of his nose.   He snores and his wife has told him that he stops breathing. He has woken up feeling short of breath. He gets up at least 3 times per night to use the restroom. Pt does drink water all day long and knows that this contributes.   He does complain of issues with frequent leg movement when he first gets into bed. He reports that at times he can fall asleep immediately and other times, it can take 5 hours to fall asleep. He was on Ambien for a long time but started having issues with awaking feeling mad. He stopped the [place] and the side effects resolved.   Pt admits to drinking 3 cups of coffee per day. He doesn't drink any coffee after 2 pm. He may have one glass of iced tea in the afternoon but typically drinks only water in the afternoon and evening. Pt goes to bed around 10-11pm and can fall asleep immediately or it may take 5 hours to fall asleep. He usually gets up around 8am.   Pt admits  to being tired all of the time. He awakes feeling tired. He will try to take a few "cat naps" throughout the day. He usually rests for about 5-10 mins. They are helpful but don't energize him for the day. He denies falling asleep driving or watching tiv. He doesn't fall asleep unless he intends to. We discussed OSA and sleep study. Pt is agreeable to an in lab sleep study. He would like to try something to help him sleep during the sleep study but is hesitant.       Past Medical History:   Diagnosis Date   ??? Atopic dermatitis    ??? Heavy metal exposure 03/22/2017   ??? Hypercholesteremia    ??? Hypertension      Controlled with medication    ??? Insomnia    ??? 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 COLONOSCOPY N/A 01/29/2017    COLONOSCOPY/ 26 performed by Ernst Breach, MD at [place]   ??? HX COLONOSCOPY  2006    benign   ??? HX HERNIA REPAIR  1979         Social History     Socioeconomic History   ??? Marital status: MARRIED     Spouse name: Not on file   ??? Number of children: Not on file   ??? Years of education: Not on file   ??? Highest education level: Not on file   Occupational History   ??? Not on file   Social Needs   ??? Financial resource strain: Not on file   ??? Food insecurity     Worry: Not on file     Inability: Not on file   ??? Transportation needs     Medical: Not on file     Non-medical: Not on file   Tobacco Use   ??? Smoking status: Never Smoker   ??? Smokeless tobacco: Never Used   Substance and Sexual Activity   ??? Alcohol use: Yes     Comment: socially   ??? Drug use: No   ??? Sexual activity: Yes     Partners: Female   Lifestyle   ??? Physical activity     Days per week: Not on file     Minutes per session: Not on file   ??? Stress: Not on file   Relationships   ??? Social Product manager on phone: Not on file  Gets together: Not on file      Attends religious service: Not on file     Active member of club or organization: Not on file     Attends meetings of clubs or organizations: Not on file     Relationship status: Not on file   ??? Intimate partner violence     Fear of current or ex partner: Not on file     Emotionally abused: Not on file     Physically abused: Not on file     Forced sexual activity: Not on file   Other Topics Concern   ??? Not on file   Social History Narrative   ??? Not on file         Family History   Problem Relation Age of Onset   ??? Cancer Mother         breast and Leukemia   ??? Heart Disease Father         CAD   ??? Hypertension Father    ??? Cancer Father         Prostate         No Known Allergies      Current Outpatient Medications   Medication Sig   ??? eszopiclone (LUNESTA) 2 mg tablet Take 1 Tab by mouth nightly. Max Daily Amount: 2 mg.   ??? amLODIPine-benazepril (LOTREL) 5-40 mg per capsule Take 1 Cap by mouth daily.   ??? rosuvastatin (CRESTOR) 5 mg tablet Take 1 Tab by mouth nightly. Indications: excessive fat in the blood, primary prevention of heart attack   ??? acetaminophen (TYLENOL) 325 mg tablet Take  by mouth as needed for Pain.   ??? ascorbic acid (VITAMIN C) 500 mg tablet Take 1,000 mg by mouth daily.   ??? cholecalciferol, vitamin D3, (VITAMIN D3) 2,000 unit tab Take  by mouth.   ??? Glucosam-Chon-Collag-Hyalur Ac 375-300-50-2 mg cap Take  by mouth daily.   ??? coenzyme q10-vitamin e (COQ10 SG 100) 100-100 mg-unit cap Take  by mouth.   ??? mometasone (ELOCON) 0.1 % topical cream Apply  to affected area two (2) times a day. Indications: Atopic Dermatitis   ??? zolpidem (AMBIEN) 10 mg tablet Take 1 Tab by mouth nightly as needed for Sleep.   ??? ibuprofen (ADVIL) 200 mg tablet Take 200 mg by mouth as needed.     No current facility-administered medications for this visit.      Review of Systems   Constitutional: Positive for malaise/fatigue.   HENT:        Sinus blockage, congestion   Eyes: Negative.    Respiratory: Negative.     Cardiovascular: Negative.    Gastrointestinal: Negative.    Genitourinary: Positive for frequency.   Musculoskeletal: Positive for joint pain.   Skin: Negative.    Neurological: Negative.    Endo/Heme/Allergies: Negative.    Psychiatric/Behavioral: The patient has insomnia.              PHYSICAL EXAM:    There were no vitals taken for this visit.     GENERAL APPEARANCE:   The patient is normal weight and in no respiratory distress, on RA.   HEENT:    Conjunctivae unremarkable.   LUNGS:   Normal respiratory effort with symmetrical lung expansion.    NEURO:   The patient is alert and oriented to person, place, and time.  Memory appears intact and mood is normal.  No gross sensorimotor deficits are present.  ASSESSMENT:  (Medical Decision Making)       ICD-10-CM ICD-9-CM    1. Snoring-The pathophysiology of obstructive sleep apnea was reviewed with the patient.  It's potential to promote severe neurologic, cardiac, pulmonary, and gastrointestinal problems was discussed. Specifically, the increased incidence of hypertension, coronary artery disease, congestive heart failure, pulmonary hypertension, gastroesophageal reflux, pathologic hypersomnolence, memory loss, and glucose intolerance was related to the consequences of hypoxemia, hypercapnia, airway obstruction, and sympathetic overdrive.  We also discussed the ability of nasal CPAP to correct these abnormalities through maintenance of a patent airway.   Check split night PSG  R06.83 786.09 REFERRAL TO SLEEP STUDIES   2. Hypersomnia-check split night PSG G47.10 780.54 REFERRAL TO SLEEP STUDIES      eszopiclone (LUNESTA) 2 mg tablet   3. Periodic limb movement-evaluate for RLS G47.61 327.51 REFERRAL TO SLEEP STUDIES      eszopiclone (LUNESTA) 2 mg tablet        PLAN:  Check split night PSG  Lunesta Rx sent to his pharmacy to use the night of his sleep study     Follow up in the sleep center in 3 months after PSG    On site physician: Dr. Enos Fling, PA  Electronically signed

## 2018-11-17 ENCOUNTER — Encounter: Attending: Urology | Primary: Internal Medicine

## 2018-11-21 ENCOUNTER — Encounter: Attending: Urology | Primary: Internal Medicine

## 2018-11-24 ENCOUNTER — Telehealth: Attending: Urology | Primary: Internal Medicine

## 2018-11-24 ENCOUNTER — Telehealth
Admit: 2018-11-24 | Discharge: 2018-11-24 | Payer: BLUE CROSS/BLUE SHIELD | Attending: Urology | Primary: Internal Medicine

## 2018-11-24 DIAGNOSIS — N138 Other obstructive and reflux uropathy: Secondary | ICD-10-CM

## 2018-11-24 DIAGNOSIS — N403 Nodular prostate with lower urinary tract symptoms: Secondary | ICD-10-CM

## 2018-11-24 NOTE — Progress Notes (Signed)
[facility]  [address] [place] [address]  [phone]    Willie Stewart  DOB: 09-04-1955    No chief complaint on file.  Follow-up on elevated PSA this was a phone note from January.  Patient's PSA about 2 months ago was 3.9 and then we repeated it as a free and total PSA.  It came back  PSA 08/05/2018 = 4.7, percent free 19.1% free giving him a 17% risk of prostate cancer which is average.  ??  I talked him on the phone I recommend keep his appointment he was to have another repeat free and total PSA in about 3 months.  ??  He did have a prostate nodule but I thought it was outside of the prostate possibly submucosally near the apex.  No need for biopsy right at this point he seems to be comfortable with that.  I discussed it with him on the phone.      Electronically signed by Maryan Rued, MD at 08/15/18 1439     PSA 04/12/2013 = 2.5  PSA 04/09/2014 = 2.6  PSA 03/06/2015 = 2.9  PSA 05/31/2018 = 3.9  PSA 08/04/2018 = 4.7, percent free 19.1% giving 17% risk prostate cancer  PSA 11/15/2018 = 4.7, percent free 12.8%, 24% risk prostate cancer     HPI     Willie Stewart is a 63 y.o. male  Patient's father has a history of prostate cancer patient discovered by his primary care doctor to possibly have a prostatic nodule.  Patient's father had seed implant several years ago and is in his 53s now.    Patient is voiding well.  No other symptoms.  Been healthy through the pandemic.  Been isolating at home.    Past Medical History:   Diagnosis Date   ??? Atopic dermatitis    ??? Heavy metal exposure 03/22/2017   ??? Hypercholesteremia    ??? Hypertension     Controlled with medication    ??? Insomnia    ??? Sinus problem      Past Surgical History:   Procedure Laterality Date   ??? COLONOSCOPY N/A 01/29/2017    COLONOSCOPY/ 26 performed by Ernst Breach, MD at [facility]   ??? HX COLONOSCOPY  2006    benign   ??? HX HERNIA REPAIR  1979     Current Outpatient Medications   Medication Sig Dispense Refill   ??? eszopiclone (LUNESTA) 2 mg  tablet Take 1 Tab by mouth nightly. Max Daily Amount: 2 mg. 5 Tab 0   ??? amLODIPine-benazepril (LOTREL) 5-40 mg per capsule Take 1 Cap by mouth daily. 90 Cap 3   ??? rosuvastatin (CRESTOR) 5 mg tablet Take 1 Tab by mouth nightly. Indications: excessive fat in the blood, primary prevention of heart attack 90 Tab 3   ??? mometasone (ELOCON) 0.1 % topical cream Apply  to affected area two (2) times a day. Indications: Atopic Dermatitis 45 g 0   ??? zolpidem (AMBIEN) 10 mg tablet Take 1 Tab by mouth nightly as needed for Sleep. 90 Tab 3   ??? acetaminophen (TYLENOL) 325 mg tablet Take  by mouth as needed for Pain.     ??? ibuprofen (ADVIL) 200 mg tablet Take 200 mg by mouth as needed.     ??? ascorbic acid (VITAMIN C) 500 mg tablet Take 1,000 mg by mouth daily.     ??? cholecalciferol, vitamin D3, (VITAMIN D3) 2,000 unit tab Take  by mouth.     ???  Glucosam-Chon-Collag-Hyalur Ac 375-300-50-2 mg cap Take  by mouth daily.     ??? coenzyme q10-vitamin e (COQ10 SG 100) 100-100 mg-unit cap Take  by mouth.       No Known Allergies  Social History     Socioeconomic History   ??? Marital status: MARRIED     Spouse name: Not on file   ??? Number of children: Not on file   ??? Years of education: Not on file   ??? Highest education level: Not on file   Occupational History   ??? Not on file   Social Needs   ??? Financial resource strain: Not on file   ??? Food insecurity     Worry: Not on file     Inability: Not on file   ??? Transportation needs     Medical: Not on file     Non-medical: Not on file   Tobacco Use   ??? Smoking status: Never Smoker   ??? Smokeless tobacco: Never Used   Substance and Sexual Activity   ??? Alcohol use: Yes     Comment: socially   ??? Drug use: No   ??? Sexual activity: Yes     Partners: Female   Lifestyle   ??? Physical activity     Days per week: Not on file     Minutes per session: Not on file   ??? Stress: Not on file   Relationships   ??? Social Product manager on phone: Not on file     Gets together: Not on file     Attends religious  service: Not on file     Active member of club or organization: Not on file     Attends meetings of clubs or organizations: Not on file     Relationship status: Not on file   ??? Intimate partner violence     Fear of current or ex partner: Not on file     Emotionally abused: Not on file     Physically abused: Not on file     Forced sexual activity: Not on file   Other Topics Concern   ??? Not on file   Social History Narrative   ??? Not on file     Family History   Problem Relation Age of Onset   ??? Cancer Mother         breast and Leukemia   ??? Heart Disease Father         CAD   ??? Hypertension Father    ??? Cancer Father         Prostate       ROS        Assessment and Plan    ICD-10-CM ICD-9-CM    1. 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 PSA elevation R97.20 790.93 PSA, TOTAL &  FREE       Orders Placed This Encounter   ??? PSA, TOTAL &  FREE     Standing Status:   Future     Standing Expiration Date:   05/23/2020     PSA total has not changed in 3 months but the percent free number went down some.  Discussed with him about doing a biopsy or waiting 3 months and doing other blood test.  We both agreed to do the PSA free and total in 3 months.  He is otherwise doing well with no  change in voiding symptoms.  We will have him come back in 3 months with a free and total PSA and do a digital rectal exam at that time.  Follow-up and Dispositions    ?? Return in about 3 months (around 02/23/2019) for to review PSA II.       Telephone  This virtual visit was conducted via telephone.  Pursuant to the emergency declaration under the [place], 1135 waiver authority and the [employer] Act, this Virtual  Visit was conducted to reduce the patient's risk of exposure to COVID-19 and provide continuity of care for an established patient.  Services were provided through a phone synchronous discussion  virtually to substitute for in-person clinic visit.  Due to this being a TeleHealth evaluation, many elements of the physical examination are unable to be assessed.    Elements of this note have been dictated using speech recognition software.  Although reviewed, errors of speech recognition may have occurred.

## 2018-11-24 NOTE — Progress Notes (Signed)
[facility]  [address] [place] [address]  [phone]    Willie Stewart  DOB: 09-16-1955    No chief complaint on file.  Follow-up on elevated PSA this was a phone note from January.  Patient's PSA about 2 months ago was 3.9 and then we repeated it as a free and total PSA.  It came back  PSA 08/05/2018 = 4.7, percent free 19.1% free giving him a 17% risk of prostate cancer which is average.  ??  I talked him on the phone I recommend keep his appointment he was to have another repeat free and total PSA in about 3 months.  ??  He did have a prostate nodule but I thought it was outside of the prostate possibly submucosally near the apex.  No need for biopsy right at this point he seems to be comfortable with that.  I discussed it with him on the phone.      Electronically signed by Maryan Rued, MD at 08/15/18 1439     PSA 04/12/2013 = 2.5  PSA 04/09/2014 = 2.6  PSA 03/06/2015 = 2.9  PSA 05/31/2018 = 3.9  PSA 08/04/2018 = 4.7, percent free 19.1% giving 17% risk prostate cancer  PSA 11/15/2018 = 4.7, percent free 12.8%, 24% risk prostate cancer     HPI     Willie Stewart is a 62 y.o. male  Patient's father has a history of prostate cancer patient discovered by his primary care doctor to possibly have a prostatic nodule.  Patient's father had seed implant several years ago and is in his 26s now.    Patient is voiding well.  No other symptoms.  Been healthy through the pandemic.  Been isolating at home.    Past Medical History:   Diagnosis Date   ??? Atopic dermatitis    ??? Heavy metal exposure 03/22/2017   ??? Hypercholesteremia    ??? Hypertension     Controlled with medication    ??? Insomnia    ??? Sinus problem      Past Surgical History:   Procedure Laterality Date   ??? COLONOSCOPY N/A 01/29/2017    COLONOSCOPY/ 26 performed by Ernst Breach, MD at [facility]   ??? HX COLONOSCOPY  2006    benign   ??? HX HERNIA REPAIR  1979     Current Outpatient Medications   Medication Sig Dispense Refill    ??? eszopiclone (LUNESTA) 2 mg tablet Take 1 Tab by mouth nightly. Max Daily Amount: 2 mg. 5 Tab 0   ??? amLODIPine-benazepril (LOTREL) 5-40 mg per capsule Take 1 Cap by mouth daily. 90 Cap 3   ??? rosuvastatin (CRESTOR) 5 mg tablet Take 1 Tab by mouth nightly. Indications: excessive fat in the blood, primary prevention of heart attack 90 Tab 3   ??? mometasone (ELOCON) 0.1 % topical cream Apply  to affected area two (2) times a day. Indications: Atopic Dermatitis 45 g 0   ??? zolpidem (AMBIEN) 10 mg tablet Take 1 Tab by mouth nightly as needed for Sleep. 90 Tab 3   ??? acetaminophen (TYLENOL) 325 mg tablet Take  by mouth as needed for Pain.     ??? ibuprofen (ADVIL) 200 mg tablet Take 200 mg by mouth as needed.     ??? ascorbic acid (VITAMIN C) 500 mg tablet Take 1,000 mg by mouth daily.     ??? cholecalciferol, vitamin D3, (VITAMIN D3) 2,000 unit tab Take  by mouth.     ???  Glucosam-Chon-Collag-Hyalur Ac 375-300-50-2 mg cap Take  by mouth daily.     ??? coenzyme q10-vitamin e (COQ10 SG 100) 100-100 mg-unit cap Take  by mouth.       No Known Allergies  Social History     Socioeconomic History   ??? Marital status: MARRIED     Spouse name: Not on file   ??? Number of children: Not on file   ??? Years of education: Not on file   ??? Highest education level: Not on file   Occupational History   ??? Not on file   Social Needs   ??? Financial resource strain: Not on file   ??? Food insecurity     Worry: Not on file     Inability: Not on file   ??? Transportation needs     Medical: Not on file     Non-medical: Not on file   Tobacco Use   ??? Smoking status: Never Smoker   ??? Smokeless tobacco: Never Used   Substance and Sexual Activity   ??? Alcohol use: Yes     Comment: socially   ??? Drug use: No   ??? Sexual activity: Yes     Partners: Female   Lifestyle   ??? Physical activity     Days per week: Not on file     Minutes per session: Not on file   ??? Stress: Not on file   Relationships   ??? Social Product manager on phone: Not on file      Gets together: Not on file     Attends religious service: Not on file     Active member of club or organization: Not on file     Attends meetings of clubs or organizations: Not on file     Relationship status: Not on file   ??? Intimate partner violence     Fear of current or ex partner: Not on file     Emotionally abused: Not on file     Physically abused: Not on file     Forced sexual activity: Not on file   Other Topics Concern   ??? Not on file   Social History Narrative   ??? Not on file     Family History   Problem Relation Age of Onset   ??? Cancer Mother         breast and Leukemia   ??? Heart Disease Father         CAD   ??? Hypertension Father    ??? Cancer Father         Prostate       ROS        Assessment and Plan    ICD-10-CM ICD-9-CM    1. 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 PSA elevation R97.20 790.93 PSA, TOTAL &  FREE       Orders Placed This Encounter   ??? PSA, TOTAL &  FREE     Standing Status:   Future     Standing Expiration Date:   05/23/2020     PSA total has not changed in 3 months but the percent free number went down some.  Discussed with him about doing a biopsy or waiting 3 months and doing other blood test.  We both agreed to do the PSA free and total in 3 months.  He is otherwise doing well with no  change in voiding symptoms.  We will have him come back in 3 months with a free and total PSA and do a digital rectal exam at that time.  Follow-up and Dispositions    ?? Return in about 3 months (around 02/23/2019) for to review PSA II.       Telephone  This virtual visit was conducted via telephone.  Pursuant to the emergency declaration under the [place], 1135 waiver authority and the [employer] Act, this Virtual  Visit was conducted to reduce the patient's risk of exposure to COVID-19 and provide continuity  of care for an established patient.  Services were provided through a phone synchronous discussion virtually to substitute for in-person clinic visit.  Due to this being a TeleHealth evaluation, many elements of the physical examination are unable to be assessed.    Elements of this note have been dictated using speech recognition software.  Although reviewed, errors of speech recognition may have occurred.

## 2018-12-29 NOTE — Telephone Encounter (Signed)
Patient is calling  Wants results of sleep study on 5/18

## 2018-12-29 NOTE — Telephone Encounter (Signed)
Patient has many questions. He was very disappointed with initial visit done virtually. He does not believe sleep study results because he did not sleep that night. I discussed openings with Santiago Glad and scheduled him with Dr. Anastasio Champion on  01/06/2019 at 9:00. I am aware that is during read time, however there are no other availabilities with a doctor. He insists he does not want to see NP or PA.

## 2019-01-06 ENCOUNTER — Ambulatory Visit: Primary: Internal Medicine

## 2019-01-06 ENCOUNTER — Ambulatory Visit: Admit: 2019-01-06 | Discharge: 2019-01-06 | Payer: BLUE CROSS/BLUE SHIELD | Primary: Internal Medicine

## 2019-01-06 ENCOUNTER — Encounter

## 2019-01-06 DIAGNOSIS — G4700 Insomnia, unspecified: Secondary | ICD-10-CM

## 2019-01-06 MED ORDER — SUVOREXANT 15 MG TABLET
15 mg | ORAL_TABLET | Freq: Every evening | ORAL | 2 refills | Status: AC | PRN
Start: 2019-01-06 — End: ?

## 2019-01-06 NOTE — Progress Notes (Signed)
Progress  Notes by Hazel Sams, MD at 01/06/19 0900                Author: Hazel Sams, MD  Service: --  Author Type: Physician       Filed: 01/06/19 1003  Encounter Date: 01/06/2019  Status: Signed          Editor: Hazel Sams, MD (Physician)               [facility]   [address], [place] [address]   [phone]      Patient Name:  Willie Stewart   Date of Birth:  March 16, 1956         Office Visit 01/06/2019        Chief Complaint       Patient presents with        ?  Sleep Apnea        ?  Results           HISTORY OF PRESENT ILLNESS:     This gentleman came into the office today for multiple issues including going over his prior sleep study, assessing his insomnia, etc.      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   However if he does not then he feels dizzy as well as nauseous.  Starting in 2003 he started taking some Ambien which helped him get a good night sleep.  He also worked Building surveyor through multiple time zones and indicated the Ambien when to use  it was effective and was able to make him function.  He indicated that he had been having some periods where he was acting out a little bit more at nighttime and thought that may have been related to the Ambien and as a results during his last visit Lunesta  was prescribed for him.  He indicated the [place] unfortunately did the same thing.  Currently he is taking high-dose melatonin at 10+ and indicates maybe 20% of the time it is helping him go to sleep.  He did indicate that he took some THC Gummies and  whether they were 10 mg or 15 mg he indicated they actually did help him rest.  Indicates in the state he cannot procure that item.  He is wondering what his options are.      Please note they did do the  polysomnogram for him and he had poor sleep efficiency of only 12.1%.  AHI was noted be 17.1.  Saturation was less than 89% for 9.7 minutes and was noted to have severe snoring.  Patient indicates he does snore is and his wife  is told him that.  Please note his BMI is only 24.      Patient has had sinus surgery in the past when he was in [place] and reported that he did have recurrence of sinus congestion again.  Before they wanted to do any further surgery in [place], they recommend that he follow-up in the sleep center for  evaluation.      Patient wants to know what to do      Epworth Scale  01/06/2019        Sitting and Reading  2     Watching  TV  2     Sitting, inactive in a public place (e.g. a movie theater or meeting)  1     As a passenger in a car for an hour, without a break  1     Lying down to rest in the afternoon, when circumstances permit  1     Sitting and talking to someone  0     Sitting quietly after lunch without alcohol  0        In a car, while stopped for a few minutes in traffic  0        Epworth Sleepiness Score  7        DIAGNOSTIC TESTS:   NPSG 12/12/18                     Past Medical History:        Diagnosis  Date         ?  Atopic dermatitis       ?  Heavy metal exposure  03/22/2017     ?  Hypercholesteremia       ?  Hypertension            Controlled with medication          ?  Insomnia           ?  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  ICD-10-CM: S92.901K   ICD-9-CM: 733.82    06/10/2017                       HTN (hypertension)  ICD-10-CM: I10   ICD-9-CM: 401.9    04/10/2013                       Mixed hyperlipidemia  ICD-10-CM: X32.4   ICD-9-CM: 272.2    04/10/2013                        Sleep disorder, circadian, shift work type  ICD-10-CM: G47.26   ICD-9-CM: 327.36    04/10/2013                       CAD (coronary artery disease)  ICD-10-CM: I25.10   ICD-9-CM: 414.00    04/10/2013                       Hemangioma of liver  ICD-10-CM: D18.03   ICD-9-CM: 228.04    04/10/2013  Past Surgical History:         Procedure  Laterality  Date          ?  COLONOSCOPY  N/A  01/29/2017          COLONOSCOPY/ 26 performed by Ernst Breach, MD at [facility]          ?  HX COLONOSCOPY    2006          benign          ?  HX HERNIA REPAIR    1979                Social History          Socioeconomic History         ?  Marital status:  MARRIED              Spouse name:  Not on file         ?  Number of children:  Not on file     ?  Years of education:  Not on file     ?  Highest education level:  Not on file       Occupational History        ?  Not on file       Social Needs         ?  Financial resource strain:  Not on file        ?  Food insecurity              Worry:  Not on file         Inability:  Not on file        ?  Transportation needs              Medical:  Not on file         Non-medical:  Not on file       Tobacco Use         ?  Smoking status:  Never Smoker     ?  Smokeless tobacco:  Never Used       Substance and Sexual Activity         ?  Alcohol use:  Yes             Comment: socially         ?  Drug use:  No     ?  Sexual activity:  Yes              Partners:  Female       Lifestyle        ?  Physical activity              Days per week:  Not on file         Minutes per session:  Not on file         ?  Stress:  Not on file       Relationships        ?  Social Health visitor on phone:  Not on file         Gets together:  Not on file         Attends religious service:  Not on file         Active member of club or organization:  Not on file  Attends meetings of clubs or organizations:  Not on file         Relationship status:  Not on file         ?  Intimate partner violence              Fear of current or ex partner:  Not on file         Emotionally abused:  Not on file         Physically abused:  Not on file         Forced sexual activity:  Not on file        Other Topics  Concern        ?  Not on file       Social History Narrative        ?  Not on file                Family History         Problem  Relation  Age of Onset          ?  Cancer  Mother                breast and Leukemia          ?  Heart Disease  Father                CAD          ?  Hypertension  Father       ?  Cancer  Father                Prostate              No Known Allergies           Current Outpatient Medications        Medication  Sig         ?  suvorexant (BELSOMRA) 15 mg tablet  Take 1 Tab by mouth nightly as needed for Insomnia. Max Daily Amount: 15 mg.         ?  eszopiclone (LUNESTA) 2 mg tablet  Take 1 Tab by mouth nightly. Max Daily Amount: 2 mg.     ?  amLODIPine-benazepril (LOTREL) 5-40 mg per capsule  Take 1 Cap by mouth daily.     ?  rosuvastatin (CRESTOR) 5 mg tablet  Take 1 Tab by mouth nightly. Indications: excessive fat in the blood, primary prevention of heart attack     ?  mometasone (ELOCON) 0.1 % topical cream  Apply  to affected area two (2) times a day. Indications: Atopic Dermatitis     ?  acetaminophen (TYLENOL) 325 mg tablet  Take  by mouth as needed for Pain.     ?  ibuprofen (ADVIL) 200 mg tablet  Take 200 mg by mouth as needed.     ?  ascorbic acid (VITAMIN C) 500 mg tablet  Take 1,000 mg by mouth daily.     ?  cholecalciferol, vitamin D3, (VITAMIN D3) 2,000 unit tab  Take  by mouth.     ?  Glucosam-Chon-Collag-Hyalur Ac 375-300-50-2 mg cap  Take  by mouth daily.         ?  coenzyme q10-vitamin e (COQ10 SG 100) 100-100 mg-unit cap  Take  by mouth.          No current facility-administered medications for this visit.  REVIEW OF SYSTEMS:       CONSTITUTIONAL:    There is NEGATIVE  history of fever, chills, night sweats.    Patient is   NEGATIVE  for weight loss, they are   NEGATIVE  for  weight gain. Patient is  POSITIVE   for fatigue and hypersomnia and  is  NOT ON    their CPAP.  Insomnia  IS NOT    under control.        CARDIAC:    No chest pain, pressure, discomfort, palpitations, orthopnea, murmurs, or edema.       GI:    No dysphagia, heartburn reflux, nausea/vomiting, diarrhea, abdominal pain, or bleeding.       NEURO:     There is no history of AMS, persistent headache, decreased level of consciousness, seizures, or motor or sensory deficits.                  PHYSICAL EXAM:      Visit Vitals      BP  134/82     Pulse  (!) 59     Temp  97 ??F (36.1 ??C)     Resp  14     Ht  5\' 6"  (1.676 m)     Wt  154 lb (69.9 kg)     SpO2  98%        BMI  24.86 kg/m??           Constitutional:  the patient is well developed and in no acute distress   EENMT:  Sclera clear, pupils equal, oral mucosa moist, modified Friedman stage IV.  Positive for narrow airway in the right nasal passage which  is decreased upon sniffing compared to left.   Respiratory: Clear to auscultation bilaterally   Cardiovascular:  RRR without M,G,R   Gastrointestinal: soft and non-tender; with positive bowel sounds.   Musculoskeletal: warm without cyanosis. There is no lower leg edema.   Skin:  no jaundice or rashes, no wounds    Neurologic: no gross neuro deficits       Psychiatric:  alert and oriented x 3            ASSESSMENT:  (Medical Decision Making)                 ICD-10-CM  ICD-9-CM             1.  Insomnia, unspecified type  G47.00  780.52  suvorexant (BELSOMRA) 15 mg tablet           2.  OSA (obstructive sleep apnea)  G47.33  327.23                 PLAN:      -Patient has 2 issues.  First is insomnia and second sleep apnea.      For the insomnia, since he wants to try different agent will first see if he can try time release melatonin 10 mg to see if that is effective.   He indicates that he is taking a short acting 10 mg tablet.  I went to see if the longer one helps him  with sleep maintenance and he is able to function.  If not we will change him to [employer] 15 mg.  I did tell him about the mechanism of Belsomra.   I told him no more Lunesta or Ambien at this time.  And THC is not an FDA approved medication at this point.     --  We also discussed proper sleep hygiene    --We also discussed developing a sleep routine to help transition towards bedtime.        For sleep apnea    -Even though the test was less than ideal and he did not sleep much, his pretest probability is extremely high given his narrow airway, only 1 functional left nostril.  I told him his probability of having sleep apnea is over 90% and I told him  at this time I do not want him to do another study. I will try to see if one of the local companies can give him a loaner machine and we can put him on an AutoPap mode of 5 to 10 cm H2O with C-Flex of 2 and use a nasal mask such as the air fit N 30 I  mask.  If this combination, really does help him then maybe he just needs to get started on an AutoPap machine.  If it makes no improvement in his condition, then can reassess how he does next visit.   He is aware, that untreated sleep apnea also causes insomnia.  Patient is agreeable with this plan.           All questions answered for the patient today.        Follow-up will be in 2 months with any advance care provider at [facility] sleep center      Follow up will be with the patient's referring physician as had been previously scheduled.      Hazel Sams, MD      Over 50% of today's office visit was spent in face to face time reviewing test results, prognosis, importance of compliance, education about disease process, benefits of  medications, instructions for management of acute flare-ups, and follow up plans.   Total time spent today was 40 minutes including reviewing prior records, going over sleep study, writing this note, etc.      Electronically signed and dictated.  Please note if there are errors this is   likely a result of the dictation software.        Orders Placed This Encounter        ?  suvorexant (BELSOMRA) 15 mg tablet             Sig: Take 1 Tab by mouth nightly as needed for Insomnia. Max Daily Amount: 15 mg.         Dispense:  30 Tab             Refill:  2

## 2019-01-06 NOTE — Progress Notes (Signed)
[facility]  [address] [place] [address]  [phone]    Patient Name:  Willie Stewart  Date of Birth:  1955-11-18      Office Visit 01/06/2019    Chief Complaint   Patient presents with   ??? Sleep Apnea   ??? Results       HISTORY OF PRESENT ILLNESS:    This gentleman came into the office today for multiple issues including going over his prior sleep study, assessing his insomnia, etc.    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  However if he does not then he feels dizzy as well as nauseous.  Starting in 2003 he started taking some Ambien which helped him get a good night sleep.  He also worked Building surveyor through multiple time zones and indicated the Ambien when to use it was effective and was able to make him function.  He indicated that he had been having some periods where he was acting out a little bit more at nighttime and thought that may have been related to the Ambien and as a results during his last visit Lunesta was prescribed for him.  He indicated the [place] unfortunately did the same thing.  Currently he is taking high-dose melatonin at 10+ and indicates maybe 20% of the time it is helping him go to sleep.  He did indicate that he took some THC Gummies and whether they were 10 mg or 15 mg he indicated they actually did help him rest.  Indicates in the state he cannot procure that item.  He is wondering what his options are.    Please note they did do the polysomnogram for him and he had poor sleep efficiency of only 12.1%.  AHI was noted be 17.1.  Saturation was less than 89% for 9.7 minutes and was noted to have severe snoring.  Patient indicates he does snore is and his wife is told him that.  Please note his BMI is only 24.     Patient has had sinus surgery in the past when he was in [place] and reported that he did have recurrence of sinus congestion again.  Before they wanted to do any further surgery in [place], they recommend that he follow-up in the sleep center for evaluation.    Patient wants to know what to do  Epworth Scale 01/06/2019   Sitting and Reading 2   Watching TV 2   Sitting, inactive in a public place (e.g. a movie theater or meeting) 1   As a passenger in a car for an hour, without a break 1   Lying down to rest in the afternoon, when circumstances permit 1   Sitting and talking to someone 0   Sitting quietly after lunch without alcohol 0   In a car, while stopped for a few minutes in traffic 0   Epworth Sleepiness Score 7     DIAGNOSTIC TESTS:  NPSG 12/12/18          Past Medical History:   Diagnosis Date   ??? Atopic dermatitis    ??? Heavy metal exposure 03/22/2017   ??? Hypercholesteremia    ??? Hypertension     Controlled with medication    ??? Insomnia    ???  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 COLONOSCOPY N/A 01/29/2017    COLONOSCOPY/ 26 performed by Ernst Breach, MD at [facility]   ??? HX COLONOSCOPY  2006    benign   ??? HX HERNIA REPAIR  1979         Social History      Socioeconomic History   ??? Marital status: MARRIED     Spouse name: Not on file   ??? Number of children: Not on file   ??? Years of education: Not on file   ??? Highest education level: Not on file   Occupational History   ??? Not on file   Social Needs   ??? Financial resource strain: Not on file   ??? Food insecurity     Worry: Not on file     Inability: Not on file   ??? Transportation needs     Medical: Not on file     Non-medical: Not on file   Tobacco Use   ??? Smoking status: Never Smoker   ??? Smokeless tobacco: Never Used   Substance and Sexual Activity   ??? Alcohol use: Yes     Comment: socially   ??? Drug use: No   ??? Sexual activity: Yes     Partners: Female   Lifestyle   ??? Physical activity     Days per week: Not on file     Minutes per session: Not on file   ??? Stress: Not on file   Relationships   ??? Social Product manager on phone: Not on file     Gets together: Not on file     Attends religious service: Not on file     Active member of club or organization: Not on file     Attends meetings of clubs or organizations: Not on file     Relationship status: Not on file   ??? Intimate partner violence     Fear of current or ex partner: Not on file     Emotionally abused: Not on file     Physically abused: Not on file     Forced sexual activity: Not on file   Other Topics Concern   ??? Not on file   Social History Narrative   ??? Not on file         Family History   Problem Relation Age of Onset   ???  Cancer Mother         breast and Leukemia   ??? Heart Disease Father         CAD   ??? Hypertension Father    ??? Cancer Father         Prostate         No Known Allergies      Current Outpatient Medications   Medication Sig   ??? suvorexant (BELSOMRA) 15 mg tablet Take 1 Tab by mouth nightly as needed for Insomnia. Max Daily Amount: 15 mg.   ??? eszopiclone (LUNESTA) 2 mg tablet Take 1 Tab by mouth nightly. Max Daily Amount: 2 mg.   ??? amLODIPine-benazepril (LOTREL) 5-40 mg per capsule Take 1 Cap by mouth daily.    ??? rosuvastatin (CRESTOR) 5 mg tablet Take 1 Tab by mouth nightly. Indications: excessive fat in the blood, primary prevention of heart attack   ??? mometasone (ELOCON) 0.1 % topical cream Apply  to affected area two (2) times a day. Indications: Atopic Dermatitis   ??? acetaminophen (TYLENOL) 325 mg tablet Take  by mouth as needed for Pain.   ??? ibuprofen (ADVIL) 200 mg tablet Take 200 mg by mouth as needed.   ??? ascorbic acid (VITAMIN C) 500 mg tablet Take 1,000 mg by mouth daily.   ??? cholecalciferol, vitamin D3, (VITAMIN D3) 2,000 unit tab Take  by mouth.   ??? Glucosam-Chon-Collag-Hyalur Ac 375-300-50-2 mg cap Take  by mouth daily.   ??? coenzyme q10-vitamin e (COQ10 SG 100) 100-100 mg-unit cap Take  by mouth.     No current facility-administered medications for this visit.            REVIEW OF SYSTEMS:     CONSTITUTIONAL:   There is NEGATIVE  history of fever, chills, night sweats.   Patient is  NEGATIVE  for weight loss, they are  NEGATIVE  for  weight gain. Patient is  POSITIVE   for fatigue and hypersomnia and is  NOT ON    their CPAP.  Insomnia  IS NOT   under control.      CARDIAC:   No chest pain, pressure, discomfort, palpitations, orthopnea, murmurs, or edema.     GI:   No dysphagia, heartburn reflux, nausea/vomiting, diarrhea, abdominal pain, or bleeding.     NEURO:    There is no history of AMS, persistent headache, decreased level of consciousness, seizures, or motor or sensory deficits.            PHYSICAL EXAM:    Visit Vitals  BP 134/82   Pulse (!) 59   Temp 97 ??F (36.1 ??C)   Resp 14   Ht 5\' 6"  (1.676 m)   Wt 154 lb (69.9 kg)   SpO2 98%   BMI 24.86 kg/m??       Constitutional:  the patient is well developed and in no acute distress  EENMT:  Sclera clear, pupils equal, oral mucosa moist, modified Friedman stage IV.  Positive for narrow airway in the right nasal passage which is decreased upon sniffing compared to left.  Respiratory: Clear to auscultation bilaterally  Cardiovascular:  RRR without M,G,R   Gastrointestinal: soft and non-tender; with positive bowel sounds.  Musculoskeletal: warm without cyanosis. There is no lower leg edema.  Skin:  no jaundice or rashes, no wounds   Neurologic: no gross neuro deficits     Psychiatric:  alert and oriented x 3        ASSESSMENT:  (Medical Decision Making)  ICD-10-CM ICD-9-CM    1. Insomnia, unspecified type G47.00 780.52 suvorexant (BELSOMRA) 15 mg tablet   2. OSA (obstructive sleep apnea) G47.33 327.23           PLAN:    ???Patient has 2 issues.  First is insomnia and second sleep apnea.    For the insomnia, since he wants to try different agent will first see if he can try time release melatonin 10 mg to see if that is effective.  He indicates that he is taking a short acting 10 mg tablet.  I went to see if the longer one helps him with sleep maintenance and he is able to function.  If not we will change him to [employer] 15 mg.  I did tell him about the mechanism of Belsomra.  I told him no more Lunesta or Ambien at this time.  And THC is not an FDA approved medication at this point.    --We also discussed proper sleep hygiene   --We also discussed developing a sleep routine to help transition towards bedtime.      For sleep apnea    ???Even though the test was less than ideal and he did not sleep much, his pretest probability is extremely high given his narrow airway, only 1 functional left nostril.  I told him his probability of having sleep apnea is over 90% and I told him at this time I do not want him to do another study. I will try to see if one of the local companies can give him a loaner machine and we can put him on an AutoPap mode of 5 to 10 cm H2O with C-Flex of 2 and use a nasal mask such as the air fit N 30 I mask.  If this combination, really does help him then maybe he just needs to get started on an AutoPap machine.  If it makes no improvement in his condition, then can reassess how he does next visit.  He is aware, that untreated sleep apnea also causes insomnia.  Patient is agreeable with this plan.        All questions answered for the patient today.      Follow-up will be in 2 months with any advance care provider at [facility] sleep center    Follow up will be with the patient's referring physician as had been previously scheduled.    Hazel Sams, MD    Over 50% of today's office visit was spent in face to face time reviewing test results, prognosis, importance of compliance, education about disease process, benefits of medications, instructions for management of acute flare-ups, and follow up plans.   Total time spent today was 40 minutes including reviewing prior records, going over sleep study, writing this note, etc.    Electronically signed and dictated.  Please note if there are errors this is  likely a result of the dictation software.    Orders Placed This Encounter   ??? suvorexant (BELSOMRA) 15 mg tablet     Sig: Take 1 Tab by mouth nightly as needed for Insomnia. Max Daily Amount: 15 mg.     Dispense:  30 Tab     Refill:  2

## 2019-01-13 NOTE — Progress Notes (Signed)
PA submitted for Belsomra

## 2019-02-01 ENCOUNTER — Other Ambulatory Visit: Admit: 2019-02-01 | Discharge: 2019-02-01 | Payer: BLUE CROSS/BLUE SHIELD | Primary: Internal Medicine

## 2019-02-01 DIAGNOSIS — N138 Other obstructive and reflux uropathy: Secondary | ICD-10-CM

## 2019-02-01 NOTE — Progress Notes (Signed)
Dr. Dimple Casey reviewed.

## 2019-02-01 NOTE — Progress Notes (Signed)
Dr. Benjamine Mola reviewed.

## 2019-02-02 LAB — PSA, TOTAL &  FREE
PSA, % Free: 12.3 %
PSA, Free: 0.65 ng/mL
Prostate Specific Ag: 5.3 ng/mL — ABNORMAL HIGH (ref 0.0–4.0)

## 2019-02-02 LAB — PSA, TOTAL AND FREE
PSA, Free Pct: 12.3 %
PSA, Free: 0.65 ng/mL
PSA: 5.3 ng/mL — ABNORMAL HIGH (ref 0.0–4.0)

## 2019-02-14 ENCOUNTER — Telehealth

## 2019-02-14 NOTE — Telephone Encounter (Addendum)
Printed for Dr. Benjamine Mola to review    ----- Message from Mammie Russian sent at 02/14/2019  9:46 AM EDT -----  Regarding: appt  Patient called said Dr R got his PSA and it was elevated. Wants to know if he needs a Bx and if he does he needs it asap because he is moving out of town 03/06/2019. Please call [phone]

## 2019-02-15 NOTE — Telephone Encounter (Signed)
TRUS scheduled. Will send medications and e-mail instructions tomorrow.

## 2019-02-15 NOTE — Telephone Encounter (Signed)
Schedule pt 7/29 @ 11:15 for Trus.

## 2019-02-15 NOTE — Telephone Encounter (Signed)
Patient has a family history of prostate cancer we have been following his PSA is upper limits of normal for his age.  Repeat PSA went up and the percent free number is unfavorable and I recommend that he have a prostate ultrasound and biopsy.  However states he is moving in August.  He has an office visit on 30 July.  We may want to do a prostate biopsy before he moves or he could find another doctor after he moves.  I am not sure where he is moving to.    Maaz Spiering MD

## 2019-02-15 NOTE — Telephone Encounter (Signed)
Left detailed message. Pt will need TR[employer]. I can work him in next week or he can find a Dealer when he moves and have it done there.

## 2019-02-16 ENCOUNTER — Encounter: Primary: Internal Medicine

## 2019-02-16 MED ORDER — SODIUM PHOSPHATES 19 GRAM-7 GRAM/118 ML ENEMA
19-7 gram/118 mL | RECTAL | 0 refills | Status: AC
Start: 2019-02-16 — End: 2019-02-16

## 2019-02-16 MED ORDER — CIPROFLOXACIN 500 MG TAB
500 mg | ORAL_TABLET | ORAL | 0 refills | Status: AC
Start: 2019-02-16 — End: ?

## 2019-02-16 MED ORDER — DIAZEPAM 10 MG TAB
10 mg | ORAL_TABLET | ORAL | 0 refills | Status: AC
Start: 2019-02-16 — End: ?

## 2019-02-16 NOTE — Telephone Encounter (Signed)
Prescriptions sent. Will e-mail instructions to patient.

## 2019-02-21 NOTE — Telephone Encounter (Signed)
TRUS- Authorization approved #[id number] [id number] [phone]

## 2019-02-22 ENCOUNTER — Ambulatory Visit: Attending: Urology | Primary: Internal Medicine

## 2019-02-22 ENCOUNTER — Inpatient Hospital Stay: Admit: 2019-02-22 | Primary: Internal Medicine

## 2019-02-22 ENCOUNTER — Ambulatory Visit: Admit: 2019-02-22 | Payer: BLUE CROSS/BLUE SHIELD | Attending: Urology | Primary: Internal Medicine

## 2019-02-22 DIAGNOSIS — R972 Elevated prostate specific antigen [PSA]: Secondary | ICD-10-CM

## 2019-02-22 LAB — AMB POC URINALYSIS DIP STICK AUTO W/ MICRO (PGU)
Bilirubin (UA POC): NEGATIVE
Bilirubin, Urine, POC: NEGATIVE
Glucose (UA POC): NEGATIVE mg/dL
Glucose, Urine, POC: NEGATIVE mg/dL
KETONES, Urine, POC: NEGATIVE
Ketones (UA POC): NEGATIVE
Leukocyte Esterase, Urine, POC: NEGATIVE
Leukocyte esterase (UA POC): NEGATIVE
Nitrite, Urine, POC: NEGATIVE
Nitrites (UA POC): NEGATIVE
Protein (UA POC): NEGATIVE
Protein, Urine, POC: NEGATIVE
Specific Gravity, Urine, POC: 1.015 NA (ref 1.001–1.035)
Specific gravity (UA POC): 1.015 (ref 1.001–1.035)
Urobilinogen (POC): 0.2
Urobilinogen, POC: 0.2
pH (UA POC): 5.5 (ref 4.6–8.0)
pH, Urine, POC: 5.5 NA (ref 4.6–8.0)

## 2019-02-22 MED ORDER — GENTAMICIN 40 MG/ML IJ SOLN
40 mg/mL | Freq: Once | INTRAMUSCULAR | Status: AC
Start: 2019-02-22 — End: 2019-02-22
  Administered 2019-02-22: 15:00:00 via INTRAMUSCULAR

## 2019-02-22 NOTE — Progress Notes (Signed)
Printed for Dr. Rice

## 2019-02-22 NOTE — Progress Notes (Signed)
[facility]  [address] [place] [address]  [phone]    Willie Stewart  DOB: 1955/07/28         HPI   63 y.o., male presents for prostate biopsy.    Patient has a family history of prostate cancer we have been following his PSA is upper limits of normal for his age.  Repeat PSA went up and the percent free number is unfavorable and I recommend that he have a prostate ultrasound and biopsy.  However states he is moving in August.      PSA 04/12/2013 = 2.5  PSA 04/09/2014 = 2.6  PSA 03/06/2015 = 2.9  PSA 05/31/2018 = 3.9  PSA 08/04/2018 = 4.7, percent free 19.1% giving 17% risk prostate cancer  PSA 11/15/2018 = 4.7, percent free 12.8%, 24% risk prostate cancer  PSA 02/01/2019 = 5.3, percent free 12.3%    Past Medical History:   Diagnosis Date   ??? Atopic dermatitis    ??? Heavy metal exposure 03/22/2017   ??? Hypercholesteremia    ??? Hypertension     Controlled with medication    ??? Insomnia    ??? Sinus problem      Past Surgical History:   Procedure Laterality Date   ??? COLONOSCOPY N/A 01/29/2017    COLONOSCOPY/ 26 performed by Ernst Breach, MD at [facility]   ??? HX COLONOSCOPY  2006    benign   ??? HX HERNIA REPAIR  1979     Current Outpatient Medications   Medication Sig Dispense Refill   ??? ciprofloxacin HCl (Cipro) 500 mg tablet Take the first pill 1 hour prior to procedure then BID until completed 14 Tab 0   ??? diazePAM (Valium) 10 mg tablet Take one hour prior to procedure. Must have driver 1 Tab 0   ??? suvorexant (BELSOMRA) 15 mg tablet Take 1 Tab by mouth nightly as needed for Insomnia. Max Daily Amount: 15 mg. 30 Tab 2   ??? eszopiclone (LUNESTA) 2 mg tablet Take 1 Tab by mouth nightly. Max Daily Amount: 2 mg. 5 Tab 0   ??? amLODIPine-benazepril (LOTREL) 5-40 mg per capsule Take 1 Cap by mouth daily. 90 Cap 3   ??? rosuvastatin (CRESTOR) 5 mg tablet Take 1 Tab by mouth nightly. Indications: excessive fat in the blood, primary prevention of heart attack 90 Tab 3   ??? mometasone (ELOCON) 0.1 %  topical cream Apply  to affected area two (2) times a day. Indications: Atopic Dermatitis 45 g 0   ??? acetaminophen (TYLENOL) 325 mg tablet Take  by mouth as needed for Pain.     ??? ibuprofen (ADVIL) 200 mg tablet Take 200 mg by mouth as needed.     ??? ascorbic acid (VITAMIN C) 500 mg tablet Take 1,000 mg by mouth daily.     ??? cholecalciferol, vitamin D3, (VITAMIN D3) 2,000 unit tab Take  by mouth.     ??? Glucosam-Chon-Collag-Hyalur Ac 375-300-50-2 mg cap Take  by mouth daily.     ??? coenzyme q10-vitamin e (COQ10 SG 100) 100-100 mg-unit cap Take  by mouth.       No Known Allergies  Social History     Socioeconomic History   ??? Marital status: MARRIED     Spouse name: Not on file   ??? Number of children: Not on file   ??? Years of education: Not on file   ??? Highest education level: Not on file   Occupational  History   ??? Not on file   Social Needs   ??? Financial resource strain: Not on file   ??? Food insecurity     Worry: Not on file     Inability: Not on file   ??? Transportation needs     Medical: Not on file     Non-medical: Not on file   Tobacco Use   ??? Smoking status: Never Smoker   ??? Smokeless tobacco: Never Used   Substance and Sexual Activity   ??? Alcohol use: Yes     Comment: socially   ??? Drug use: No   ??? Sexual activity: Yes     Partners: Female   Lifestyle   ??? Physical activity     Days per week: Not on file     Minutes per session: Not on file   ??? Stress: Not on file   Relationships   ??? Social Product manager on phone: Not on file     Gets together: Not on file     Attends religious service: Not on file     Active member of club or organization: Not on file     Attends meetings of clubs or organizations: Not on file     Relationship status: Not on file   ??? Intimate partner violence     Fear of current or ex partner: Not on file     Emotionally abused: Not on file     Physically abused: Not on file     Forced sexual activity: Not on file   Other Topics Concern   ??? Not on file   Social History Narrative   ??? Not on  file     Family History   Problem Relation Age of Onset   ??? Cancer Mother         breast and Leukemia   ??? Heart Disease Father         CAD   ??? Hypertension Father    ??? Cancer Father         Prostate       UA - Dipstick  Results for orders placed or performed in visit on 02/22/19   AMB POC URINALYSIS DIP STICK AUTO W/ MICRO (PGU)     Status: None   Result Value Ref Range Status    Glucose (UA POC) Negative Negative mg/dL Final    Bilirubin (UA POC) Negative Negative Final    Ketones (UA POC) Negative Negative Final    Specific gravity (UA POC) 1.015 1.001 - 1.035 Final    Blood (UA POC) Trace Negative Final    pH (UA POC) 5.5 4.6 - 8.0 Final    Protein (UA POC) Negative Negative Final    Urobilinogen (POC) 0.2 mg/dL  Final    Nitrites (UA POC) Negative Negative Final    Leukocyte esterase (UA POC) Negative Negative Final       UA - Micro  WBC - 0  RBC - 0  Bacteria - 0  Epith - 0            TRANSRECTAL ULTRASOUND GUIDED BIOPSY OF THE PROSTATE    All risks, benefits and alternatives were again reviewed and he is willing to proceed at this time.  The patient was placed in the left lateral decubitus position and the transrectal ultrasound probe was inserted into the rectum.  The prostate was visualized and measured.  The prostate appeared homogenous in appearance.  Measured prostate volume  is listed below.  10 cc of 1% lidocaine (plain) was infiltrated in the neurovascular bundles bilaterally.  12 needle core biopsies were obtained using a standard sextant biopsy format.  Three far lateral biopsies were also obtained bilaterally.  The ultrasound probe was removed.  The patient tolerated the procedure well.   Discharge instructions were again reviewed with the patient in detail.  He was instructed to resume his antibiotics as previously ordered and was instructed to stay off all anticoagulation for 72 hours or until no further bleeding (hematuria or hematochezia) is noted.  He was instructed to call the office  immediately for any fevers or significant bleeding.    PROSTATE VOLUME: Prostate measured right at about 30 mL in volume.  Minimal post void residual.  Questionable hypoechoic area on the right near the mid transition zone.  Prostate was not too large.  Using anesthetic of lidocaine the transrectal ultrasound and prostate biopsies performed with extra core samples at the mid on each side so a total of 14 cores were taken patient tolerated procedure will call back next week check on the result before he moves out of town.    Assessment and Plan    ICD-10-CM ICD-9-CM    1. PSA elevation  R97.20 790.93 SURGICAL PATHOLOGY      AMB POC [place], TRANSRECTAL      BIOPSY OF PROSTATE,NEEDLE/PUNCH      SURGICAL TRAYS      AMB POC URINALYSIS DIP STICK AUTO W/ MICRO (PGU)      gentamicin (GARAMYCIN) injection 80 mg     Orders Placed This Encounter   ??? BIOPSY OF PROSTATE,NEEDLE/PUNCH   ??? AMB POC [place], TRANSRECTAL     Order Specific Question:   Reason for Exam     Answer:   elevated psa   ??? AMB POC URINALYSIS DIP STICK AUTO W/ MICRO (PGU)   ??? SURGICAL TRAYS   ??? gentamicin (GARAMYCIN) injection 80 mg   ??? SURGICAL PATHOLOGY     Order Specific Question:   Type of Specimen and Locations?     Answer:   prostate     Order Specific Question:   Patient's clinical history:     Answer:   elevated psa     Follow-up and Dispositions    ?? Return for Next week check on results of biopsy.

## 2019-02-22 NOTE — Progress Notes (Signed)
[facility]  [address]   [place] [address]  [phone]    Willie Stewart  DOB: 08-Jul-1956         HPI   63 y.o., male presents for prostate biopsy.    Patient has a family history of prostate cancer we have been following his PSA is upper limits of normal for his age.  Repeat PSA went up and the percent free number is unfavorable and I recommend that he have a prostate ultrasound and biopsy.  However states he is moving in August.      PSA 04/12/2013 = 2.5  PSA 04/09/2014 = 2.6  PSA 03/06/2015 = 2.9  PSA 05/31/2018 = 3.9  PSA 08/04/2018 = 4.7, percent free 19.1% giving 17% risk prostate cancer  PSA 11/15/2018 = 4.7, percent free 12.8%, 24% risk prostate cancer  PSA 02/01/2019 = 5.3, percent free 12.3%    Past Medical History:   Diagnosis Date   ??? Atopic dermatitis    ??? Heavy metal exposure 03/22/2017   ??? Hypercholesteremia    ??? Hypertension     Controlled with medication    ??? Insomnia    ??? Sinus problem      Past Surgical History:   Procedure Laterality Date   ??? COLONOSCOPY N/A 01/29/2017    COLONOSCOPY/ 26 performed by Ernst Breach, MD at [facility]   ??? HX COLONOSCOPY  2006    benign   ??? HX HERNIA REPAIR  1979     Current Outpatient Medications   Medication Sig Dispense Refill   ??? ciprofloxacin HCl (Cipro) 500 mg tablet Take the first pill 1 hour prior to procedure then BID until completed 14 Tab 0   ??? diazePAM (Valium) 10 mg tablet Take one hour prior to procedure. Must have driver 1 Tab 0   ??? suvorexant (BELSOMRA) 15 mg tablet Take 1 Tab by mouth nightly as needed for Insomnia. Max Daily Amount: 15 mg. 30 Tab 2   ??? eszopiclone (LUNESTA) 2 mg tablet Take 1 Tab by mouth nightly. Max Daily Amount: 2 mg. 5 Tab 0   ??? amLODIPine-benazepril (LOTREL) 5-40 mg per capsule Take 1 Cap by mouth daily. 90 Cap 3   ??? rosuvastatin (CRESTOR) 5 mg tablet Take 1 Tab by mouth nightly. Indications: excessive fat in the blood, primary prevention of heart attack 90 Tab 3    ??? mometasone (ELOCON) 0.1 % topical cream Apply  to affected area two (2) times a day. Indications: Atopic Dermatitis 45 g 0   ??? acetaminophen (TYLENOL) 325 mg tablet Take  by mouth as needed for Pain.     ??? ibuprofen (ADVIL) 200 mg tablet Take 200 mg by mouth as needed.     ??? ascorbic acid (VITAMIN C) 500 mg tablet Take 1,000 mg by mouth daily.     ??? cholecalciferol, vitamin D3, (VITAMIN D3) 2,000 unit tab Take  by mouth.     ??? Glucosam-Chon-Collag-Hyalur Ac 375-300-50-2 mg cap Take  by mouth daily.     ??? coenzyme q10-vitamin e (COQ10 SG 100) 100-100 mg-unit cap Take  by mouth.       No Known Allergies  Social History     Socioeconomic History   ??? Marital status: MARRIED     Spouse name: Not on file   ??? Number of children: Not on file   ??? Years of education: Not on file   ??? Highest education level: Not on file   Occupational  History   ??? Not on file   Social Needs   ??? Financial resource strain: Not on file   ??? Food insecurity     Worry: Not on file     Inability: Not on file   ??? Transportation needs     Medical: Not on file     Non-medical: Not on file   Tobacco Use   ??? Smoking status: Never Smoker   ??? Smokeless tobacco: Never Used   Substance and Sexual Activity   ??? Alcohol use: Yes     Comment: socially   ??? Drug use: No   ??? Sexual activity: Yes     Partners: Female   Lifestyle   ??? Physical activity     Days per week: Not on file     Minutes per session: Not on file   ??? Stress: Not on file   Relationships   ??? Social Product manager on phone: Not on file     Gets together: Not on file     Attends religious service: Not on file     Active member of club or organization: Not on file     Attends meetings of clubs or organizations: Not on file     Relationship status: Not on file   ??? Intimate partner violence     Fear of current or ex partner: Not on file     Emotionally abused: Not on file     Physically abused: Not on file     Forced sexual activity: Not on file   Other Topics Concern   ??? Not on file    Social History Narrative   ??? Not on file     Family History   Problem Relation Age of Onset   ??? Cancer Mother         breast and Leukemia   ??? Heart Disease Father         CAD   ??? Hypertension Father    ??? Cancer Father         Prostate       UA - Dipstick  Results for orders placed or performed in visit on 02/22/19   AMB POC URINALYSIS DIP STICK AUTO W/ MICRO (PGU)     Status: None   Result Value Ref Range Status    Glucose (UA POC) Negative Negative mg/dL Final    Bilirubin (UA POC) Negative Negative Final    Ketones (UA POC) Negative Negative Final    Specific gravity (UA POC) 1.015 1.001 - 1.035 Final    Blood (UA POC) Trace Negative Final    pH (UA POC) 5.5 4.6 - 8.0 Final    Protein (UA POC) Negative Negative Final    Urobilinogen (POC) 0.2 mg/dL  Final    Nitrites (UA POC) Negative Negative Final    Leukocyte esterase (UA POC) Negative Negative Final       UA - Micro  WBC - 0  RBC - 0  Bacteria - 0  Epith - 0            TRANSRECTAL ULTRASOUND GUIDED BIOPSY OF THE PROSTATE     All risks, benefits and alternatives were again reviewed and he is willing to proceed at this time.  The patient was placed in the left lateral decubitus position and the transrectal ultrasound probe was inserted into the rectum.  The prostate was visualized and measured.  The prostate appeared homogenous in appearance.  Measured prostate  volume is listed below.  10 cc of 1% lidocaine (plain) was infiltrated in the neurovascular bundles bilaterally.  12 needle core biopsies were obtained using a standard sextant biopsy format.  Three far lateral biopsies were also obtained bilaterally.  The ultrasound probe was removed.  The patient tolerated the procedure well.   Discharge instructions were again reviewed with the patient in detail.  He was instructed to resume his antibiotics as previously ordered and was instructed to stay off all anticoagulation for 72 hours or until no further bleeding (hematuria or hematochezia) is noted.  He was instructed to call the office immediately for any fevers or significant bleeding.    PROSTATE VOLUME: Prostate measured right at about 30 mL in volume.  Minimal post void residual.  Questionable hypoechoic area on the right near the mid transition zone.  Prostate was not too large.  Using anesthetic of lidocaine the transrectal ultrasound and prostate biopsies performed with extra core samples at the mid on each side so a total of 14 cores were taken patient tolerated procedure will call back next week check on the result before he moves out of town.    Assessment and Plan    ICD-10-CM ICD-9-CM    1. PSA elevation  R97.20 790.93 SURGICAL PATHOLOGY      AMB POC [place], TRANSRECTAL      BIOPSY OF PROSTATE,NEEDLE/PUNCH      SURGICAL TRAYS      AMB POC URINALYSIS DIP STICK AUTO W/ MICRO (PGU)      gentamicin (GARAMYCIN) injection 80 mg     Orders Placed This Encounter   ??? BIOPSY OF PROSTATE,NEEDLE/PUNCH   ??? AMB POC [place], TRANSRECTAL     Order Specific Question:   Reason for Exam      Answer:   elevated psa   ??? AMB POC URINALYSIS DIP STICK AUTO W/ MICRO (PGU)   ??? SURGICAL TRAYS   ??? gentamicin (GARAMYCIN) injection 80 mg   ??? SURGICAL PATHOLOGY     Order Specific Question:   Type of Specimen and Locations?     Answer:   prostate     Order Specific Question:   Patient's clinical history:     Answer:   elevated psa     Follow-up and Dispositions    ?? Return for Next week check on results of biopsy.

## 2019-02-23 ENCOUNTER — Encounter: Attending: Urology | Primary: Internal Medicine

## 2019-03-06 NOTE — Telephone Encounter (Signed)
Virtual visit made for 8/28 for talk bx as the patient is moving out of town.  He would like to know if Dr Benjamine Mola could call him today or tomorrow with the results before he leaves to move.

## 2019-03-07 NOTE — Telephone Encounter (Signed)
Result have been printed for Dr. Benjamine Mola. Will call patient once they are reviewed.

## 2019-03-08 ENCOUNTER — Telehealth

## 2019-03-08 NOTE — Telephone Encounter (Signed)
Patient had a recent prostate biopsy 02/22/2019.  He came back with 2 areas with small volume prostate cancer of the right mid was a Gleason score 3+3 involving less than 5% of 1 core and in the right lateral apex less than 5% of the core had 3+3 cancer.    He is presently moving to [place] and I actually talked him on the phone in transit with his [employer] to [facility].    I told him that eventually once the acute hemorrhage and inflammation from the biopsy resolved he probably need to have an MRI.  There is no immediate urgency and he will need a urologist probably down there to evaluate him.  We can get him any results to him but the doctors down there could get the MRI and I suggested that his probable best therapy would probably be surgery in the near future.  I discussed the robotic prostatectomy and there is probably plenty of urologist in the [place] area with all the retirement communities it would be able to do the robotic prostatectomy and have a lot of skill set with that.    Impression 2 out of 12 core samples Gleason 6 involving less than 5% of 2 of the cores gives him a low stage prostate cancer    Recommend MRI staging in the near future and evaluation by urologist in [place]

## 2019-03-08 NOTE — Telephone Encounter (Signed)
Dr. Benjamine Mola spoke with patient.

## 2019-03-15 ENCOUNTER — Encounter: Attending: Urology | Primary: Internal Medicine

## 2019-03-17 ENCOUNTER — Telehealth

## 2019-03-17 NOTE — Telephone Encounter (Signed)
Per insurance reason, Pt has to have MRI in [facility]. MRI ordered.

## 2019-03-24 ENCOUNTER — Encounter: Payer: BLUE CROSS/BLUE SHIELD | Attending: Urology | Primary: Internal Medicine

## 2019-04-12 ENCOUNTER — Telehealth

## 2019-04-12 MED ORDER — DIAZEPAM 10 MG TAB
10 mg | ORAL_TABLET | ORAL | 0 refills | Status: AC
Start: 2019-04-12 — End: ?

## 2019-04-12 NOTE — Telephone Encounter (Signed)
Patient had a positive prostate biopsy and is going to have an MRI.  He moved to [place] right after the biopsy needs to come back into town to have the MRI follow-up.  And I will try to send Valium tablet to take before the MRI to his pharmacy in [place].    I cannot send the prescription to [place] so I printed it.    Orders Placed This Encounter   ??? diazePAM (Valium) 10 mg tablet     Sig: Take Valium 10 mg 1 hour prior to MRI procedure dispense #1 no refill  Indications: Sedation prior to imaging study     Dispense:  1 Tab     Refill:  0

## 2019-04-27 ENCOUNTER — Ambulatory Visit: Payer: BLUE CROSS/BLUE SHIELD | Primary: Internal Medicine

## 2019-05-22 ENCOUNTER — Encounter

## 2019-05-22 MED ORDER — ROSUVASTATIN 5 MG TAB
5 mg | ORAL_TABLET | Freq: Every evening | ORAL | 0 refills | Status: DC
Start: 2019-05-22 — End: 2019-05-24

## 2019-05-22 MED ORDER — AMLODIPINE-BENAZEPRIL 5 MG-40 MG CAP
5-40 mg | ORAL_CAPSULE | Freq: Every day | ORAL | 0 refills | Status: DC
Start: 2019-05-22 — End: 2019-05-24

## 2019-05-22 MED ORDER — MOMETASONE 0.1 % TOPICAL CREAM
0.1 % | Freq: Two times a day (BID) | CUTANEOUS | 0 refills | Status: DC
Start: 2019-05-22 — End: 2019-05-24

## 2019-05-22 NOTE — Telephone Encounter (Signed)
Patient Willie Stewart that he just recently moved to [facility] and he requested 90 day supply on three medications until his medicare starts which will be in Feb. Also he requesting the medications to be mailed to him at his new home in [facility].       [address]; [place] [address].Please advise Thanks

## 2019-05-24 MED ORDER — ROSUVASTATIN 5 MG TAB
5 mg | ORAL_TABLET | Freq: Every evening | ORAL | 0 refills | Status: AC
Start: 2019-05-24 — End: ?

## 2019-05-24 MED ORDER — MOMETASONE 0.1 % TOPICAL CREAM
0.1 % | Freq: Two times a day (BID) | CUTANEOUS | 0 refills | Status: AC
Start: 2019-05-24 — End: ?

## 2019-05-24 MED ORDER — AMLODIPINE-BENAZEPRIL 5 MG-40 MG CAP
5-40 mg | ORAL_CAPSULE | Freq: Every day | ORAL | 0 refills | Status: AC
Start: 2019-05-24 — End: ?

## 2019-05-24 NOTE — Telephone Encounter (Signed)
Patient moved to [facility] and he haven't found a PCP as of yet. Patient requested to mail rx [place]  [address]      Requested Prescriptions     Pending Prescriptions Disp Refills   ??? mometasone (ELOCON) 0.1 % topical cream 50 g 0     Sig: Apply  to affected area two (2) times a day. Indications: a type of allergy that causes red and itchy skin called atopic dermatitis   ??? amLODIPine-benazepril (LOTREL) 5-40 mg per capsule 90 Cap 0     Sig: Take 1 Cap by mouth daily.   ??? rosuvastatin (Crestor) 5 mg tablet 90 Tab 0     Sig: Take 1 Tab by mouth nightly. Indications: excessive fat in the blood, primary prevention of heart attack

## 2019-06-08 ENCOUNTER — Encounter: Primary: Internal Medicine

## 2019-06-15 ENCOUNTER — Encounter: Attending: Internal Medicine | Primary: Internal Medicine

## 2021-07-30 IMAGING — CT CT CALCIUM SCORING
1 series · 15 of 20 positions shown, 19 images · non-contrast
Comparison: There are no previous exams available for comparison.

________________________________________________________________________________________________ 
CT CALCIUM SCORING, 07/30/2021 [DATE]:
INDICATION: Evaluate coronary artery calcification.

[Series 2: cascoreseq 3.0 b35s 60% · axial · 0.52mm/px · z∈[+854,+976]mm · 15 of 91 slices shown, 19 images]
[im 5/91  vessel]
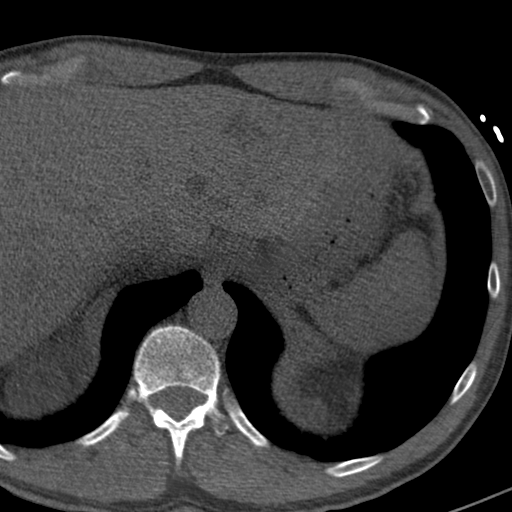
[im 5/91  lung]
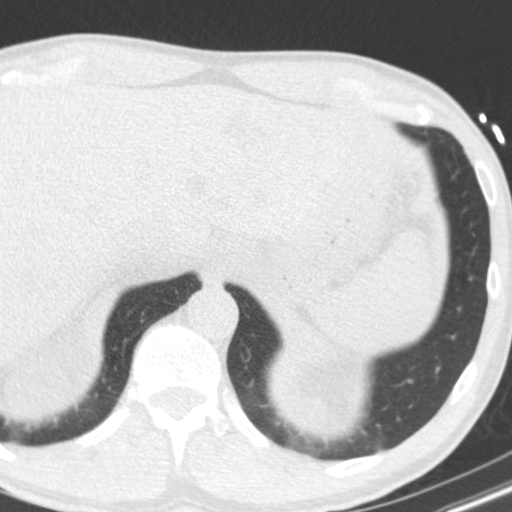
[im 10/91  vessel]
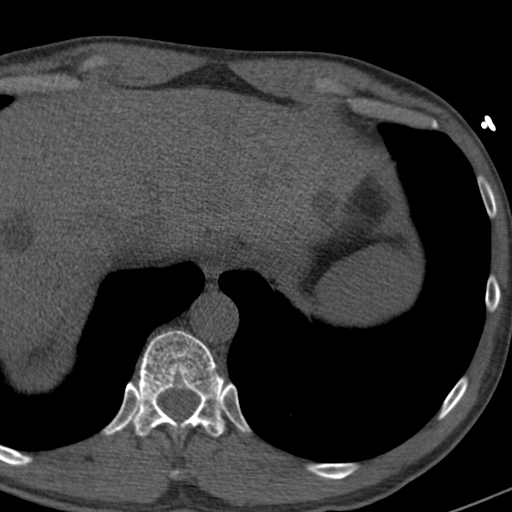
[im 19/91  vessel]
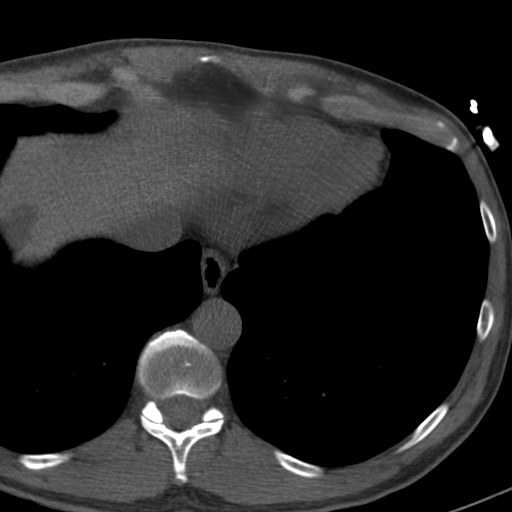
[im 24/91  vessel]
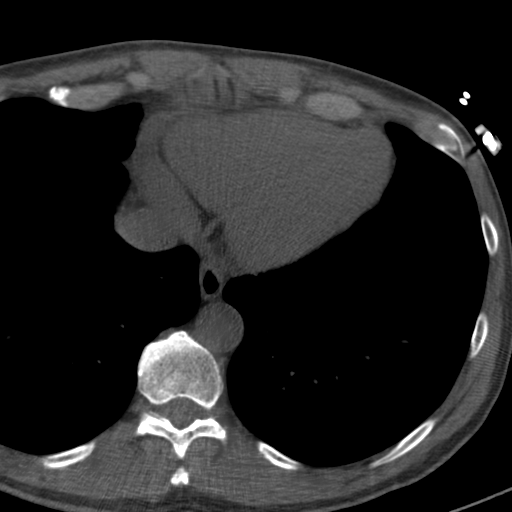
[im 29/91  vessel]
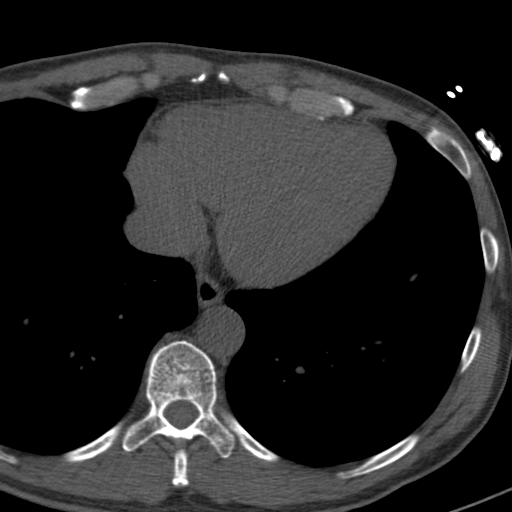
[im 29/91  lung]
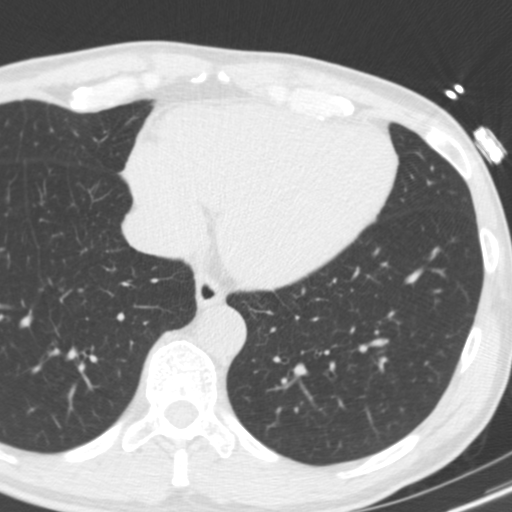
[im 34/91  vessel]
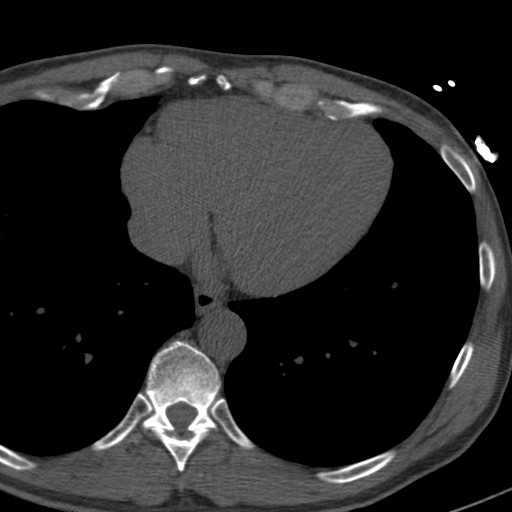
[im 38/91  vessel]
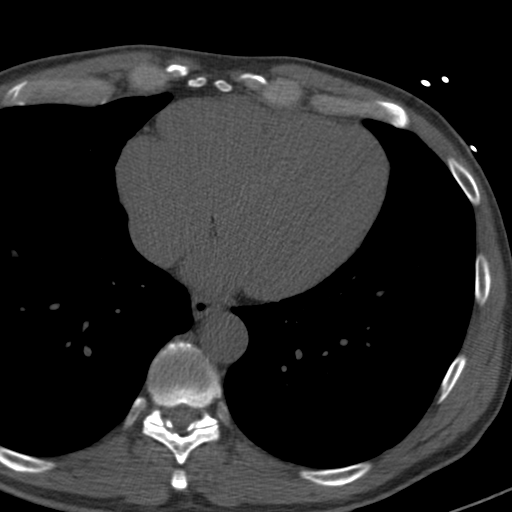
[im 48/91  vessel]
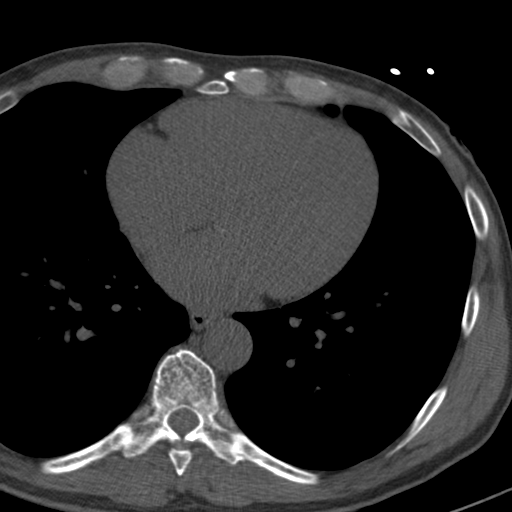
[im 53/91  vessel]
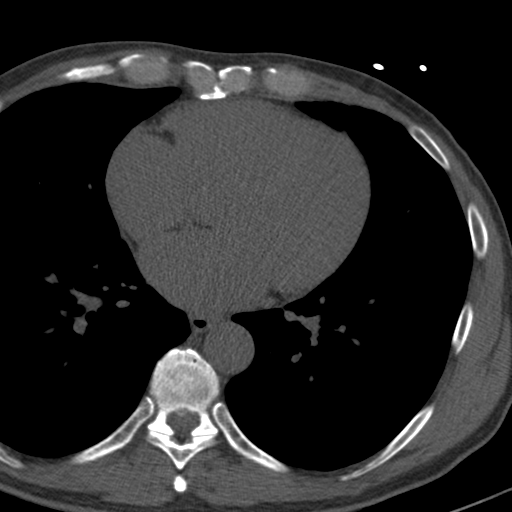
[im 53/91  lung]
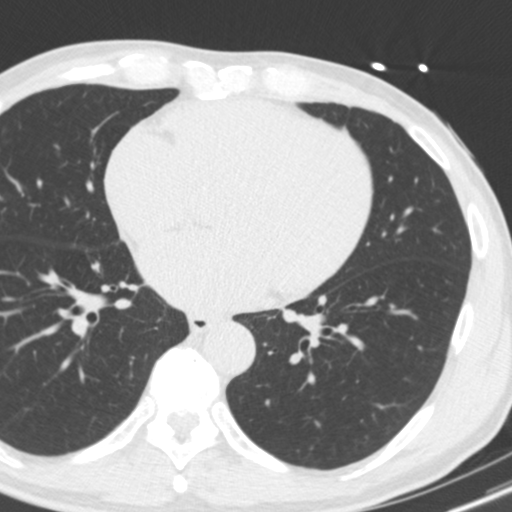
[im 57/91  vessel]
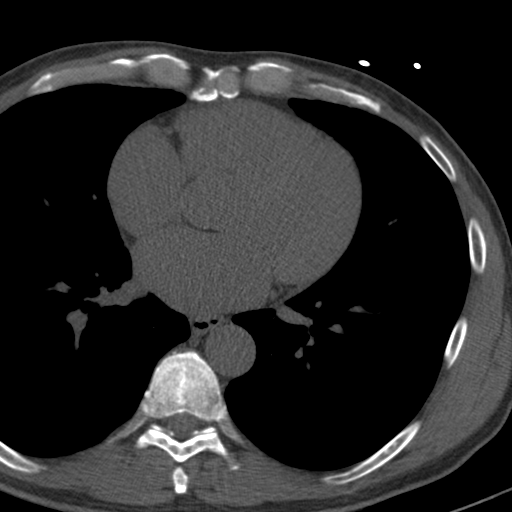
[im 62/91  vessel]
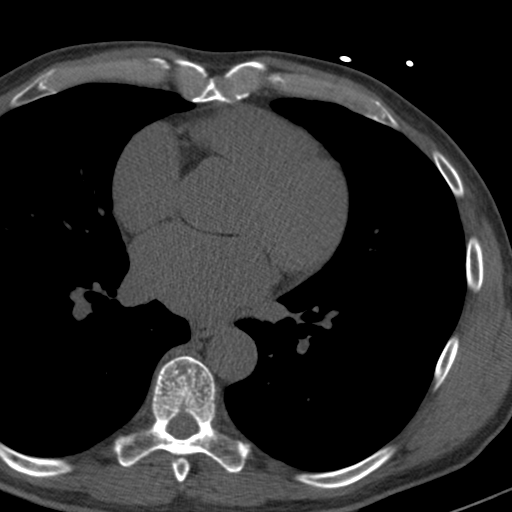
[im 67/91  vessel]
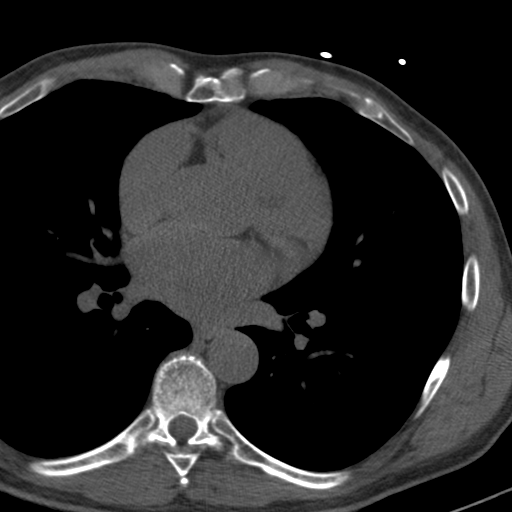
[im 76/91  vessel]
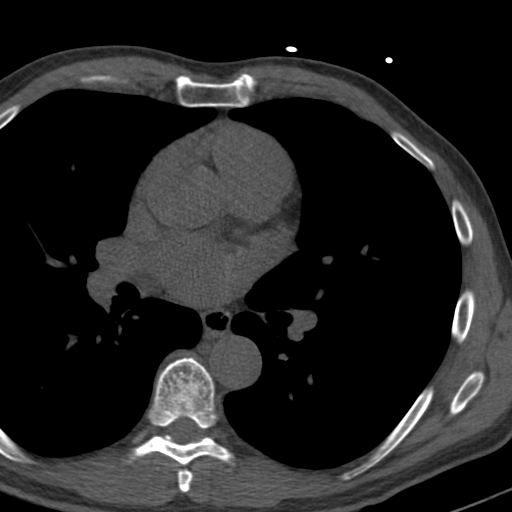
[im 76/91  lung]
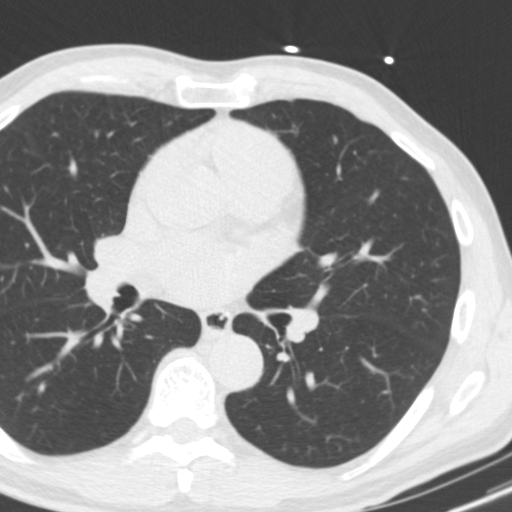
[im 81/91  vessel]
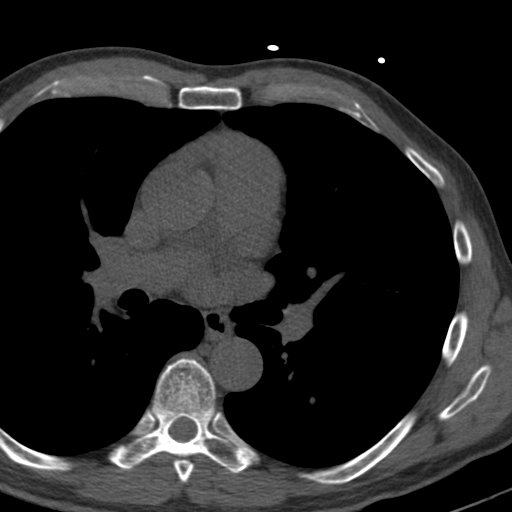
[im 86/91  vessel]
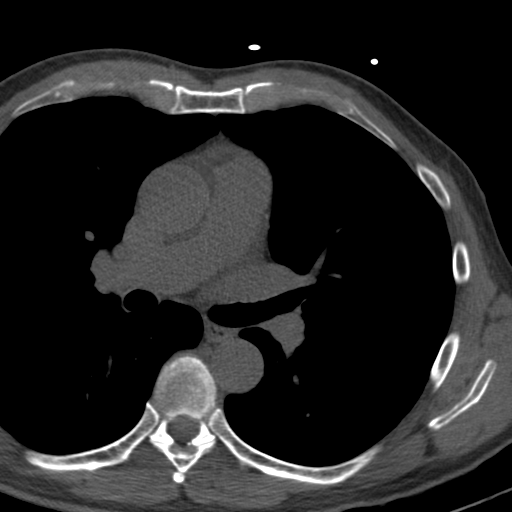

[15 of 20 positions shown; findings below may reference images not displayed]

FINDINGS: There is a 7 mm cyst identified within the left lateral sulcus. Would consider 
dedicated CT the chest better evaluate. Presumed cyst are seen within the liver.
IMPRESSION: Total Calcium Score: 39 
7 mm nodule left lateral sulcus incompletely evaluated. Recommend CT chest to 
better evaluate. 
Calcium score                                     Presence of Plaque 
0                                                    No evidence of plaque 
1-10                                               Minimal evidence of plaque 
11-100                                            Mild evidence of plaque 
101-400                                          Moderate evidence of plaque 
Over 400                                        Extensive evidence of plaque     

Calcium scoring sheets to follow. 
RADIATION DOSE REDUCTION: All CT scans are performed using radiation dose 
reduction techniques, when applicable.  Technical factors are evaluated and 
adjusted to ensure appropriate moderation of exposure.  Automated dose 
management technology is applied to adjust the radiation doses to minimize 
exposure while achieving diagnostic quality images.

## 2021-08-13 IMAGING — CT CT ABDOMEN AND PELVIS WITHOUT CONTRAST
2 of 3 series · 15 of 46 positions shown, 17 images · non-contrast
Comparison: There are no prior exam(s) available for comparison within the past 
12 months;

________________________________________________________________________________________________ 
CT ABDOMEN AND PELVIS WITHOUT CONTRAST, 08/13/2021 [DATE]: 
CLINICAL INDICATION: Lower abdominal pain. Question hernia. 
A search for DICOM formatted images was conducted for prior CT imaging studies 
completed at a non-affiliated media free facility.
TECHNIQUE: The abdomen and pelvis were scanned from lung bases through the 
pubic rami without contrast on a high-resolution CT scanner using dose reduction 
techniques. Routine MPR reconstructions were performed.

[Series 2: abd/pel wo · axial · 0.73mm/px · z∈[-497,-125]mm · 12 of 144 slices shown, 14 images]
[im 10/144  soft-tissue]
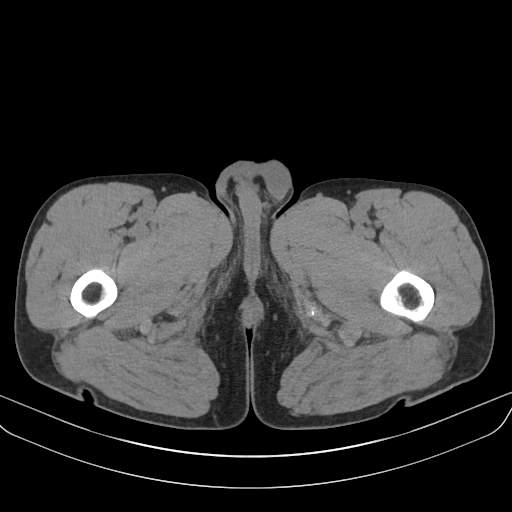
[im 10/144  bone]
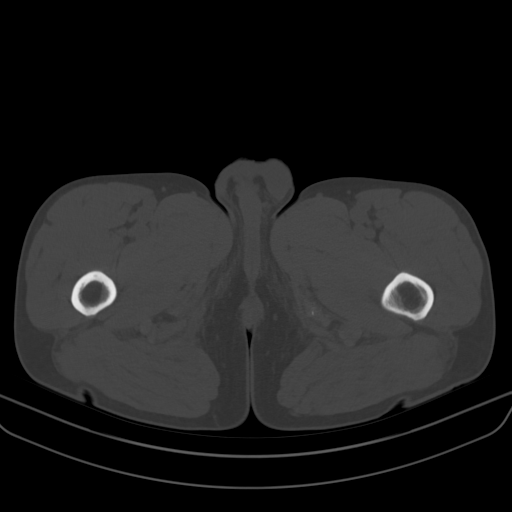
[im 19/144  soft-tissue]
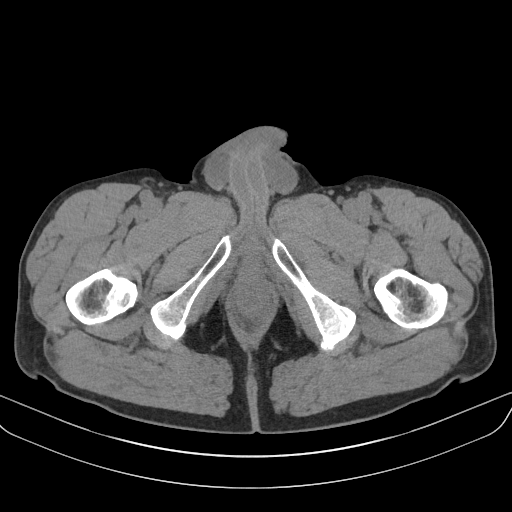
[im 33/144  soft-tissue]
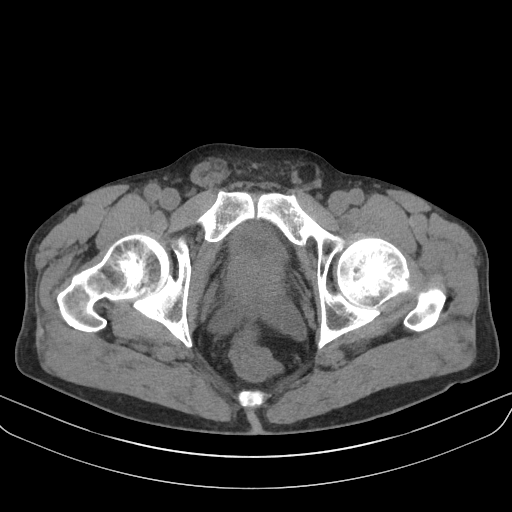
[im 42/144  soft-tissue]
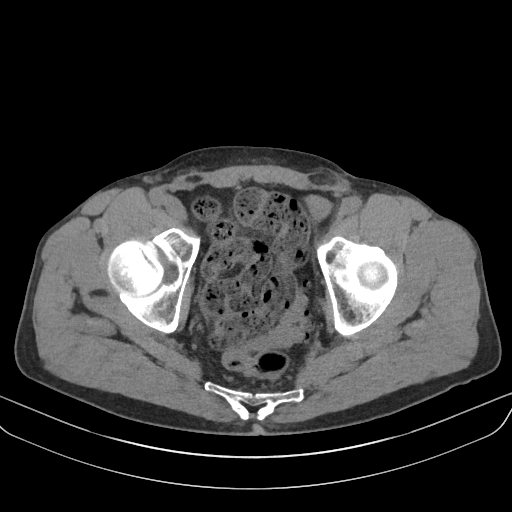
[im 56/144  soft-tissue]
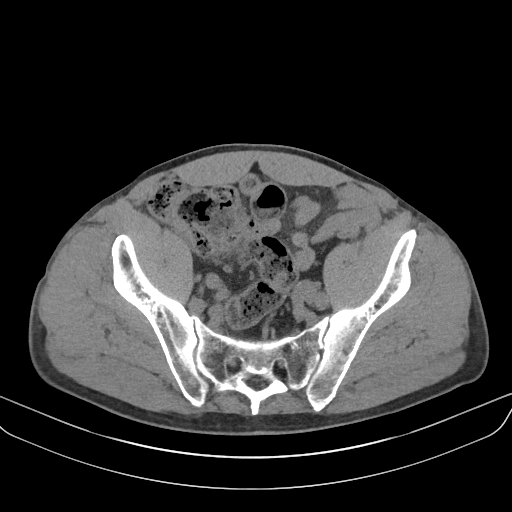
[im 65/144  soft-tissue]
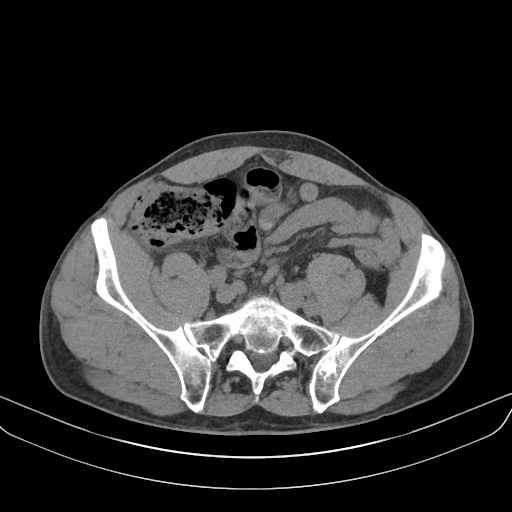
[im 79/144  soft-tissue]
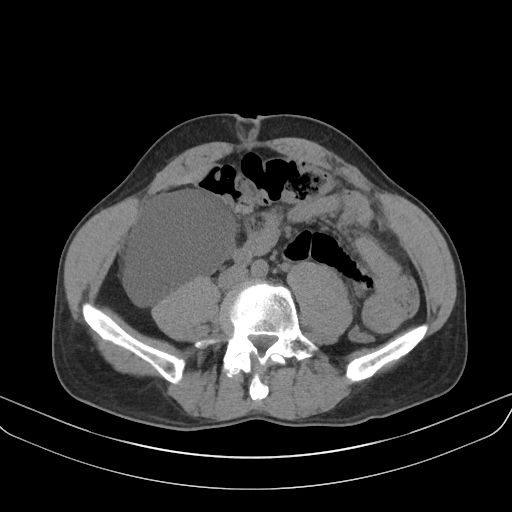
[im 88/144  soft-tissue]
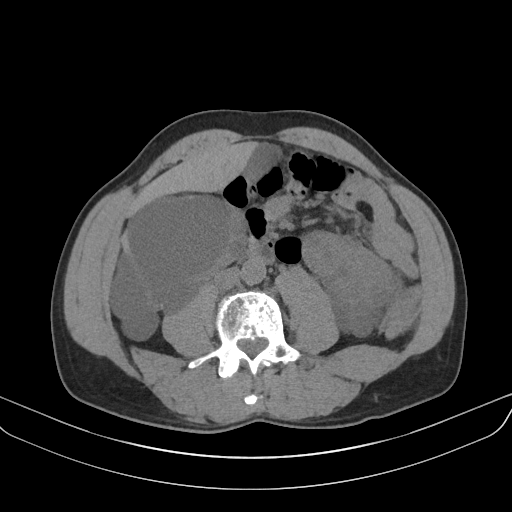
[im 102/144  soft-tissue]
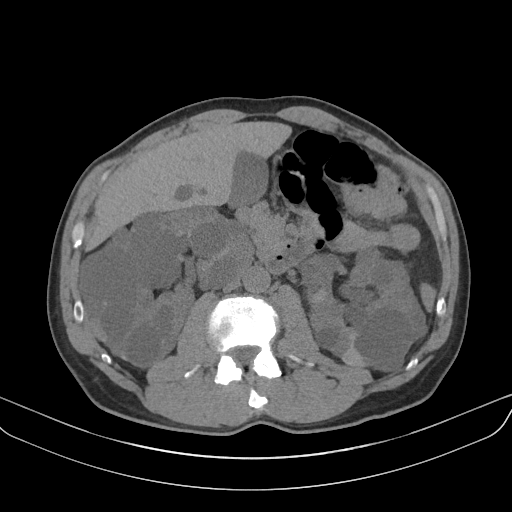
[im 102/144  bone]
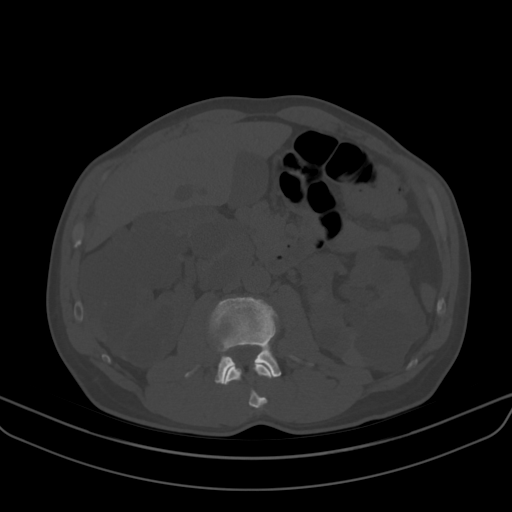
[im 111/144  soft-tissue]
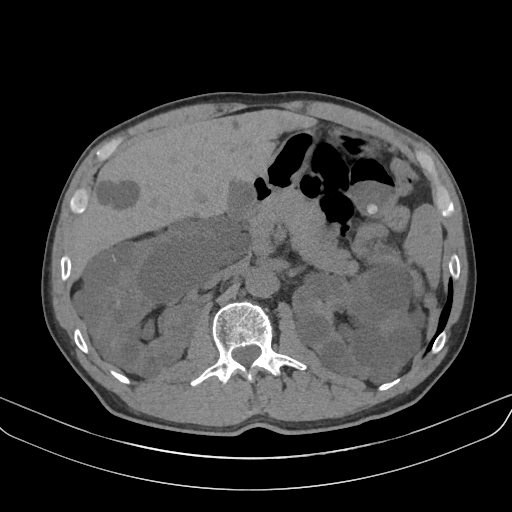
[im 125/144  soft-tissue]
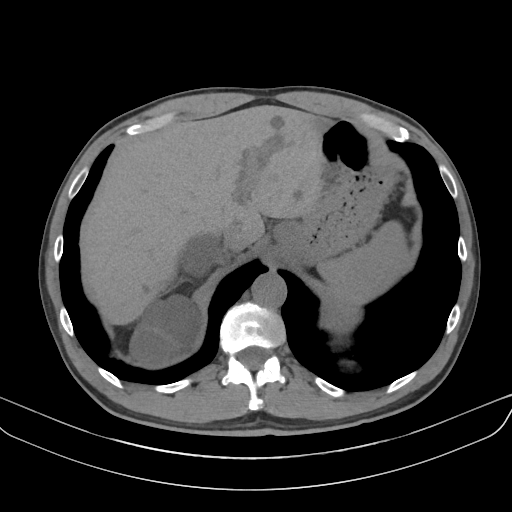
[im 134/144  soft-tissue]
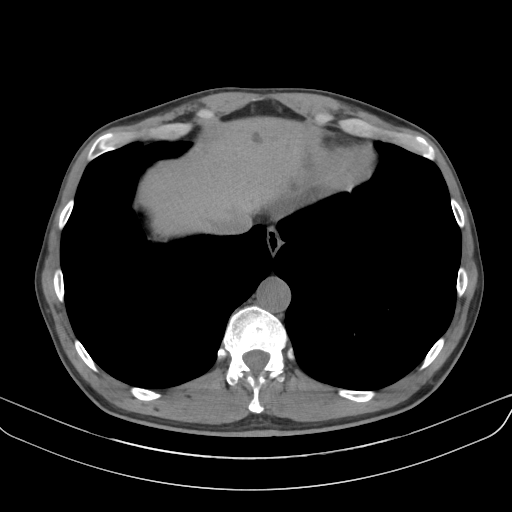

[Series 3: abd/pel cor · coronal · 0.70mm/px · 3 of 125 slices shown]
[im 42/125  soft-tissue]
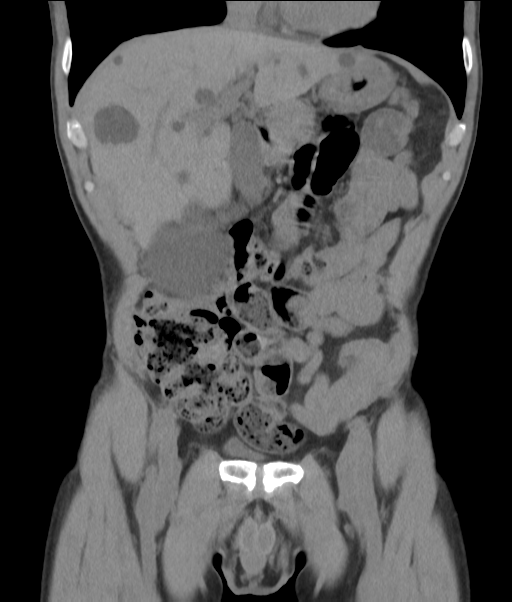
[im 56/125  soft-tissue]
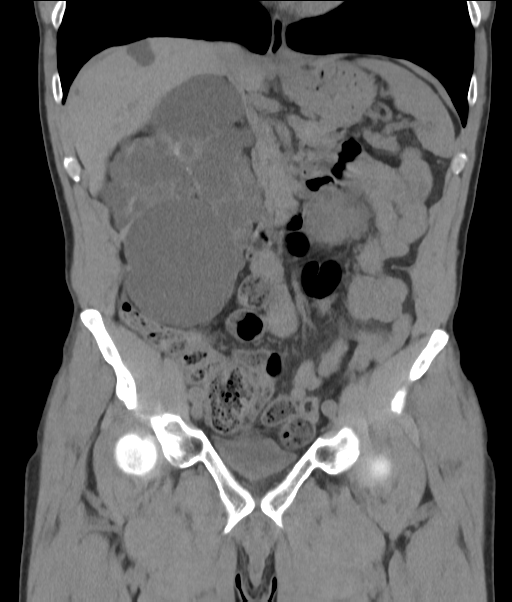
[im 69/125  soft-tissue]
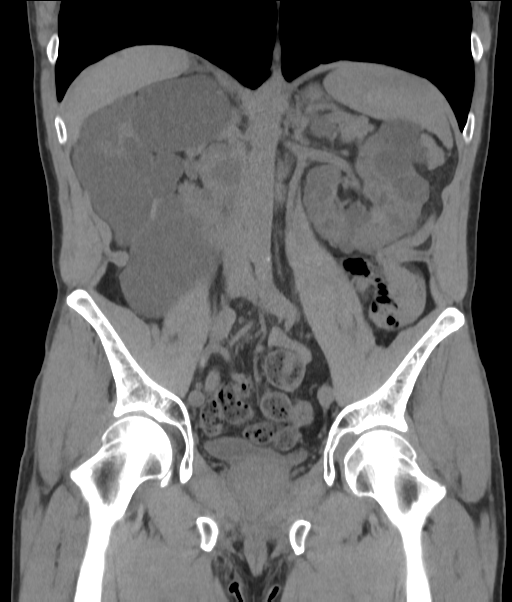

[15 of 46 positions shown; findings below may reference images not displayed]

however, comparison was made to the prior exam(s) 05/06/2020 CT scan 
of the pelvis.
FINDINGS: LUNG BASES: 5 mm nodule left lateral sulcus. 
HEPATOBILIARY: Numerous cysts are identified. No mass seen. No gallstones. 
SPLEEN: Normal in size. 
PANCREAS: No evidence for ductal dilatation or mass.   
ADRENALS: No mass. 
GENITOURINARY: Numerous cysts are seen replacing the kidneys bilaterally. Some 
of these are hyperdense and there are some nonspecific calcifications. No 
hydronephrosis seen. Bladder is unremarkable. 
LYMPH NODES: No adenopathy. 
STOMACH, SMALL BOWEL AND COLON: No bowel wall thickening or obstruction. 
VASCULAR STRUCTURES: Mild atherosclerotic changes without aneurysmal dilatation. 
MUSCULOSKELETAL: Mild scoliosis and degenerative changes.  
ADDITIONAL FINDINGS: There is mild thickening of the wall of the right inguinal 
canal. Significance and etiology uncertain. Appears mildly progressive since the 
April 2020 CT scan. No recurrent hernia. No abnormal fluid collection.
IMPRESSION: Nonspecific thickening of the wall of the right inguinal canal. MRI may better 
define. No recurrent hernia seen at this time. 
Numerous cysts are replacing the majority of the kidneys some of which are 
hyperdense. Numerous hepatic cyst identified as well. Nonspecific calcifications 
overlying the kidneys without hydronephrosis. 
Mild atherosclerotic changes as well as scoliosis and degenerative changes. 
5 mm left lower lobe nodule. Please refer to Fleischner guidelines listed below 
for follow-up recommendations. 
REFERENCE: Recommendations for pulmonary nodule follow-up according to 
[HOSPITAL] Guidelines. 
Solitary nodule size: < 6 mm 
*Low-risk patients: No followup needed. 
*High-risk patients: Optional CT at 12 months. 
Solitary Nodule size: 6-8 mm 
*Low-risk patients: Followup at 6-12 months, then consider further follow-up at 
18-24 months. 
*High-risk patients: Initial followup CT at 6-12 months and then at 18-24 months 
if no change. 
Solitary Nodule size: >8 mm 
*Either low or high risk: 
*Consider follow-up CT at 3 months, and/or CT-PET, and/or biopsy. 
NOTE: 
Low risk patients: minimal or absent history of smoking and or other known risk 
factors. 
High risk patients: history of smoking or of other known risk factors (e.g. 
first degree relative with lung cancer, or exposure to asbestos, radon uranium) 
If a nodule up to 8mm is partly solid or is ground glass further follow up is 
required after 24 months to exclude possible slow growing adenocarcinoma (MAACHIITOO) 
Size is average of length and width. 
RADIATION DOSE REDUCTION: All CT scans are performed using radiation dose 
reduction techniques, when applicable.  Technical factors are evaluated and 
adjusted to ensure appropriate moderation of exposure.  Automated dose 
management technology is applied to adjust the radiation doses to minimize 
exposure while achieving diagnostic quality images.

## 2022-06-24 IMAGING — MR MRI BRAIN WITH  IAC W/WO CONTRAST
11 of 16 series · 22 of 48 positions shown · IV contrast (gadavist)
Comparison: None

________________________________________________________________________________________________ 
MRI BRAIN WITH  IAC W/WO CONTRAST,06/24/2022 [DATE]: 
CLINICAL INDICATION: Left-sided hearing loss. Prostate cancer.
TECHNIQUE: MRI performed without and with contrast using IAC protocol.  7 mL of 
Gadavist were injected intravenously by hand. 0.5 mL of Gadavist were discarded. 
Patient was scanned on a 1.5T magnet.

[Series 102: mpr - smartbrain · axial · 1.1mm · 1.09mm/px · 1 of 2 slices shown]
[im 1/2]
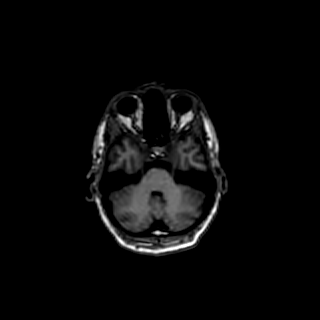

[Series 203: dadc map · axial · 5.0mm · 1.03mm/px · 1 of 26 slices shown]
[im 1/26]
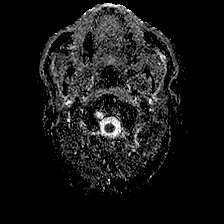

[Series 204: (id) · axial · 5.0mm · 1.03mm/px · 1 of 27 slices shown]
[im 1/27]
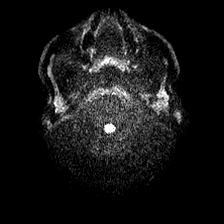

[Series 301: t1_se_sag · sagittal · 4.0mm · 0.43mm/px · 1 of 28 slices shown]
[im 1/28]
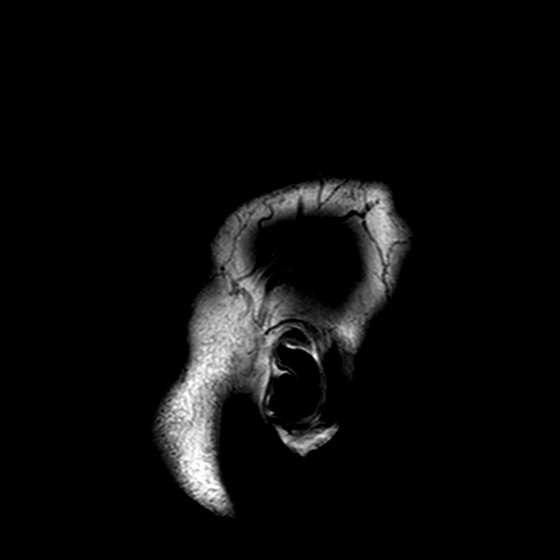

[Series 401: flair_ax+fs · axial · 5.0mm · 0.49mm/px · 1 of 27 slices shown]
[im 1/27]
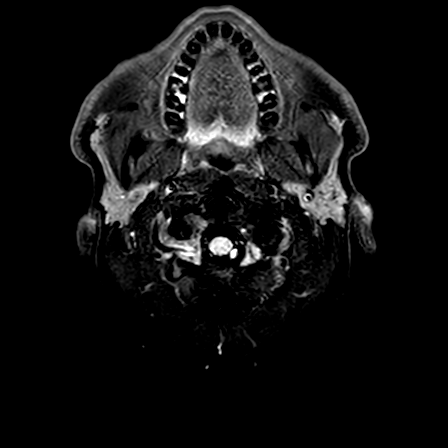

[Series 502: swip · axial · 10.0mm · 0.36mm/px · z∈[-63,+70]mm · 8 of 140 slices shown]
[im 1/140]
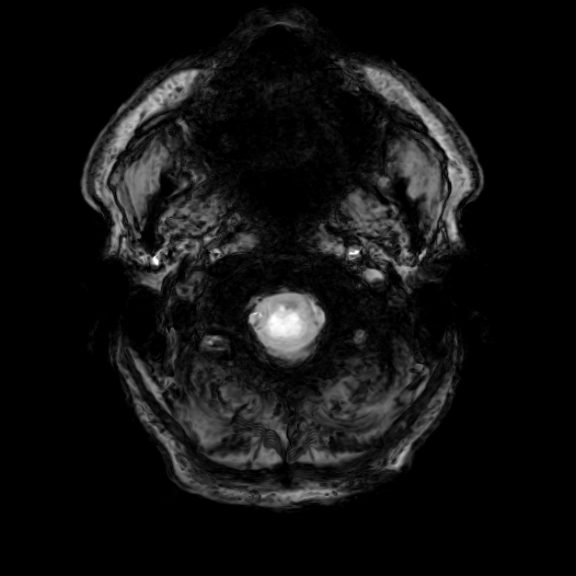
[im 16/140]
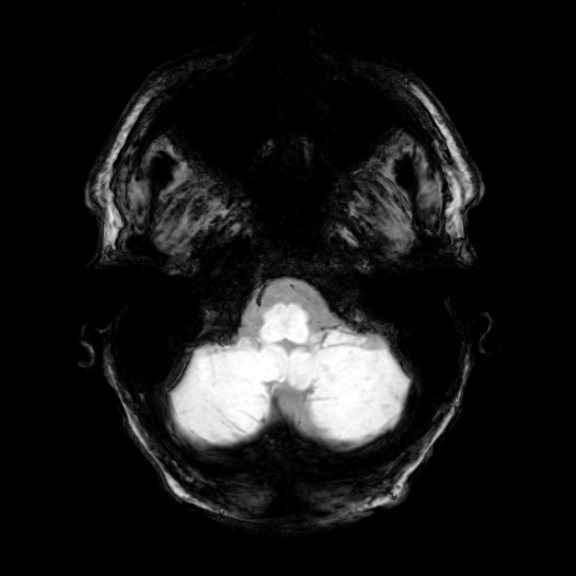
[im 47/140]
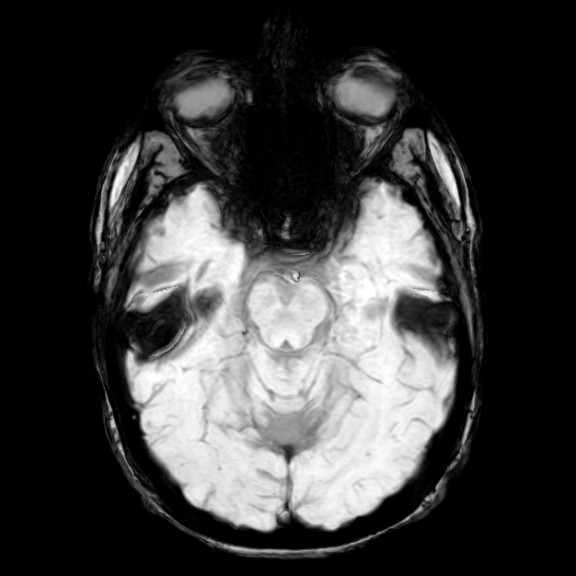
[im 62/140]
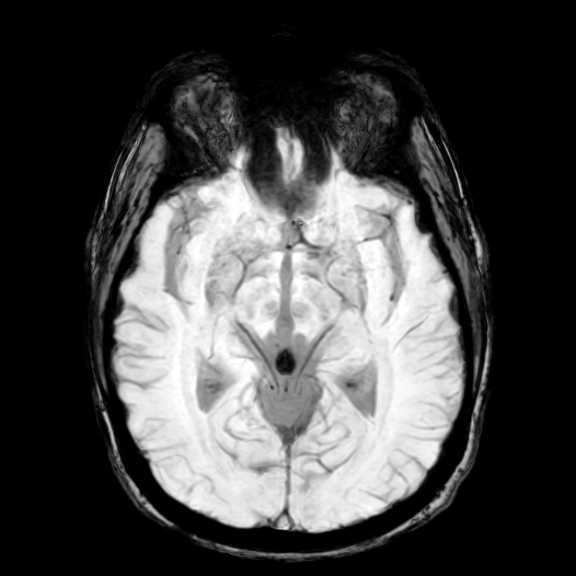
[im 78/140]
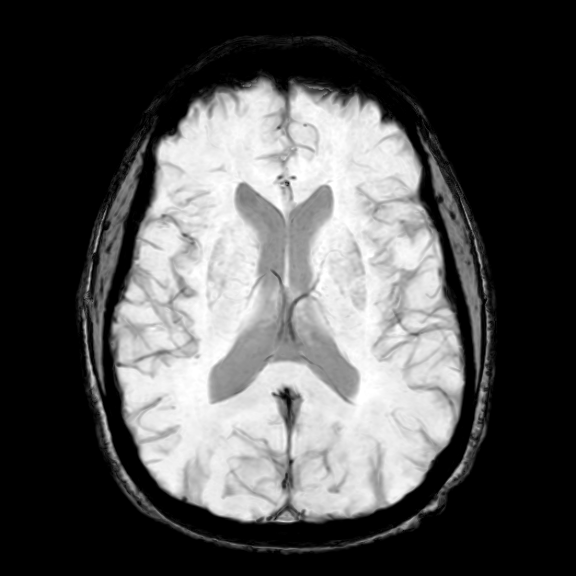
[im 93/140]
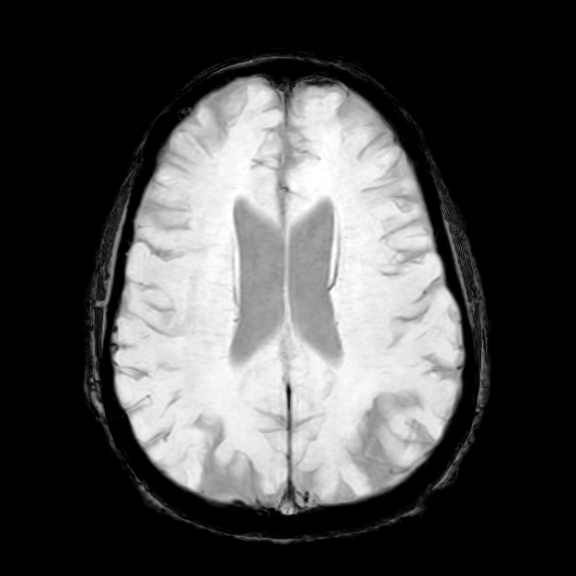
[im 124/140]
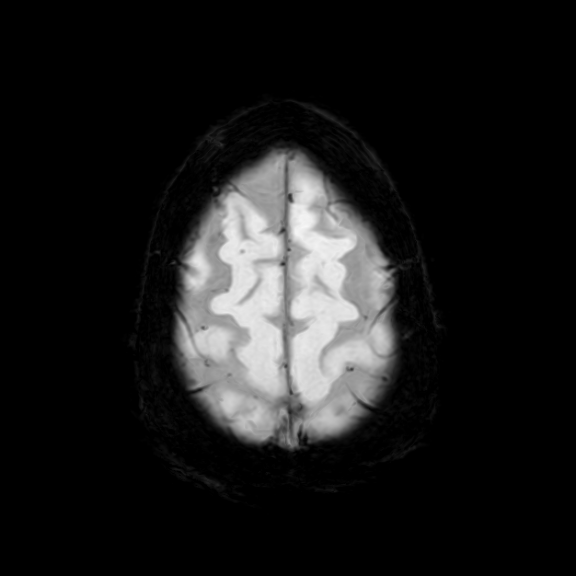
[im 140/140]
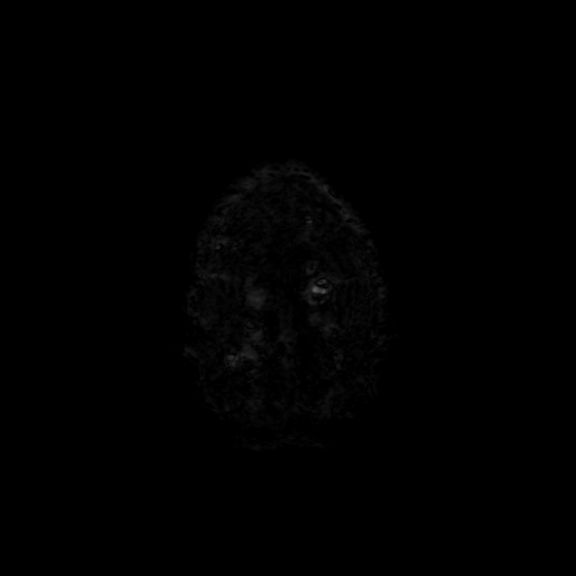

[Series 601: 3d_t2_tra_thin · axial · 1.1mm · 0.35mm/px · z∈[-44,-14]mm · 4 of 60 slices shown]
[im 1/60]
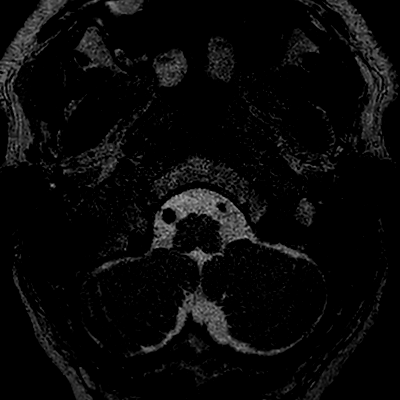
[im 20/60]
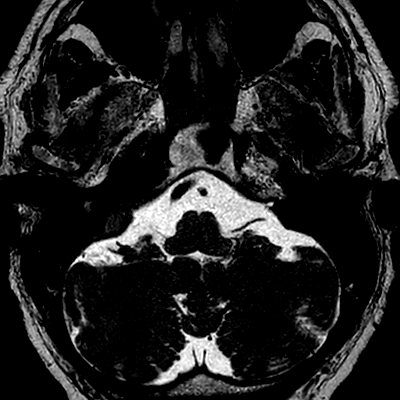
[im 40/60]
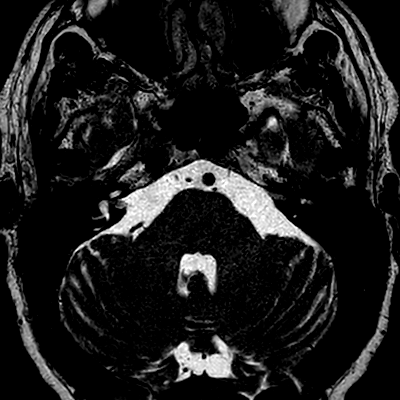
[im 60/60]
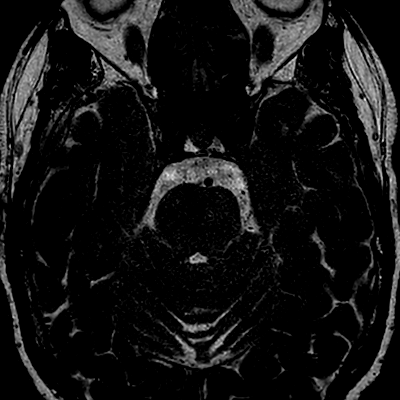

[Series 701: T1 · axial · 3.0mm · 0.59mm/px · 1 of 11 slices shown]
[im 1/11]
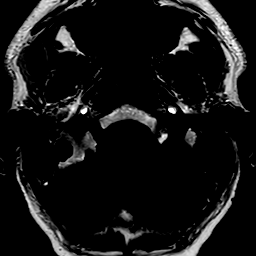

[Series 1001: T1 fat-sat post-contrast · axial · 3.0mm · 0.59mm/px · 1 of 11 slices shown (1 of 2)]
[im 1/11]
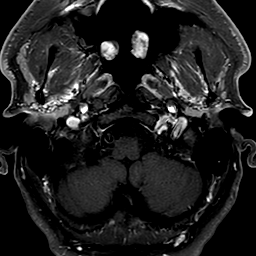

[Series 1101: T1 fat-sat post-contrast · coronal · 3.0mm · 0.59mm/px · 1 of 11 slices shown (2 of 2)]
[im 1/11]
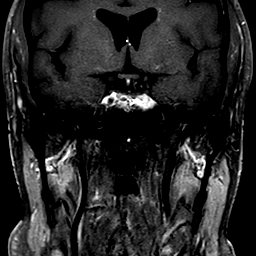

[Series 1201: T1 post-contrast · axial · 5.0mm · 0.51mm/px · z∈[-73,+77]mm · 2 of 27 slices shown]
[im 1/27]
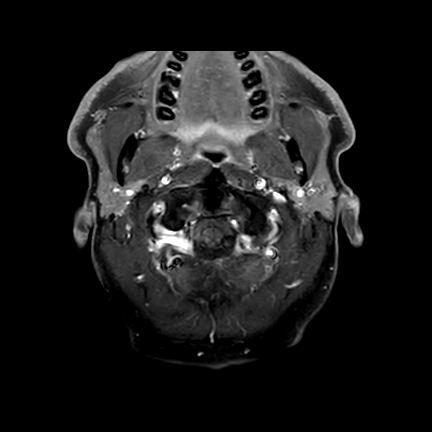
[im 27/27]
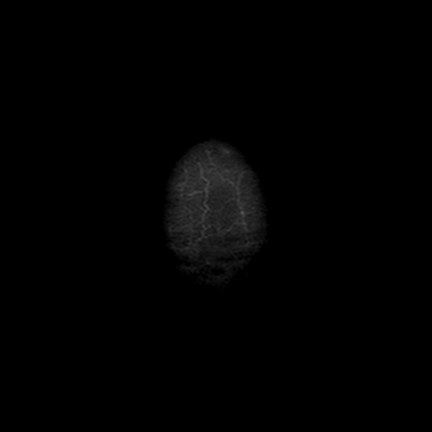

[22 of 48 positions shown; findings below may reference images not displayed]

FINDINGS: -------------------------------------------------------------------------------- 
------ 
INTERNAL AUDITORY CANALS:  
Dedicated imaging of the internal auditory canals demonstrates normal cranial 
nerve VIl and Goriva complexes bilaterally. No cerebellopontine angle or internal 
auditory canal mass. No pathologic cranial nerve or temporal bone contrast 
enhancement. Inner ear structures demonstrate expected fluid signal intensity. 
No middle ear or mastoid effusion. Vascular loop right cerebellar pontine angle 
cistern. This minimally enters the right porus acusticus. 
-------------------------------------------------------------------------------- 
------ 
INTRACRANIAL:  
Minimal nonspecific periventricular and deep white matter T2 FLAIR 
hyperintensity. No mass or abnormal extra-axial fluid collection. There are 
prominent perivascular spaces at the frontal vertex bilaterally. 
No acute ischemia.  No abnormal foci of susceptibility artifact in the brain. 
Patency of the intracranial vascular flow voids.  No acute intracranial 
hemorrhage, mass effect, midline shift.  No hydrocephalus. Cerebral volume is 
age appropriate. No pathologic contrast enhancement. 
-------------------------------------------------------------------------------- 
------ 
OTHER: 
ORBITS/SINUSES:  Visualized orbits show no acute abnormality or mass.  
Visualized paranasal sinuses are clear. 
MARROW SIGNAL/SOFT TISSUES: No focal suspect signal abnormality. 
-------------------------------------------------------------------------------- 
------
IMPRESSION: Essentially normal MRI of the internal auditory canals and labyrinthine 
structures. 
No acute intracranial process. Minimal nonspecific periventricular and deep 
white matter T2 FLAIR hyperintensities. No mass, mass effect, or abnormal 
contrast enhancement.

## 2022-09-07 IMAGING — CT CT CHEST WITHOUT CONTRAST
2 of 5 series · 14 of 36 positions shown, 17 images · non-contrast
Comparison: CT 07/30/2021.

________________________________________________________________________________________________ 
CT CHEST WITHOUT CONTRAST, 09/07/2022 [DATE]: 
CLINICAL INDICATION: Pulmonary nodule. 
A search for DICOM formatted images was conducted for prior CT imaging studies 
completed at a non-affiliated media free facility.
TECHNIQUE: The chest was scanned from base of neck through the lung bases 
without contrast on a high resolution low dose CT scanner. Routine MPR and MIP 
reconstruction images were performed. Count of known CT and Cardiac Nuclear 
Medicine studies performed in the previous 12 months = 0.

[Series 3: lung · axial · 0.77mm/px · z∈[-285,+15]mm · 11 of 180 slices shown, 14 images]
[im 15/180  mediastinal]
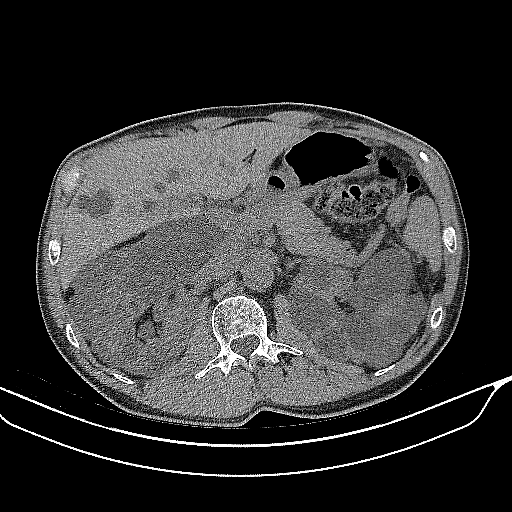
[im 15/180  lung]
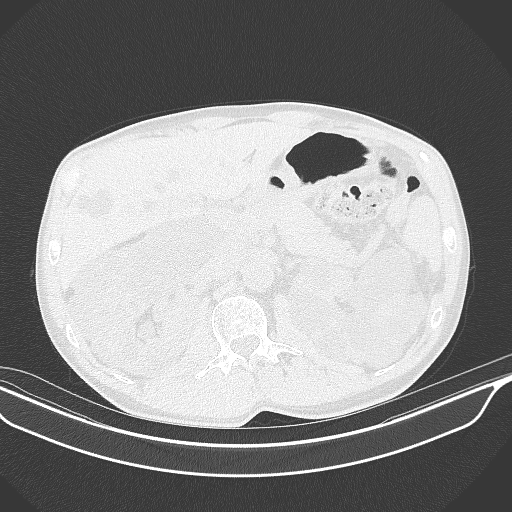
[im 30/180  lung]
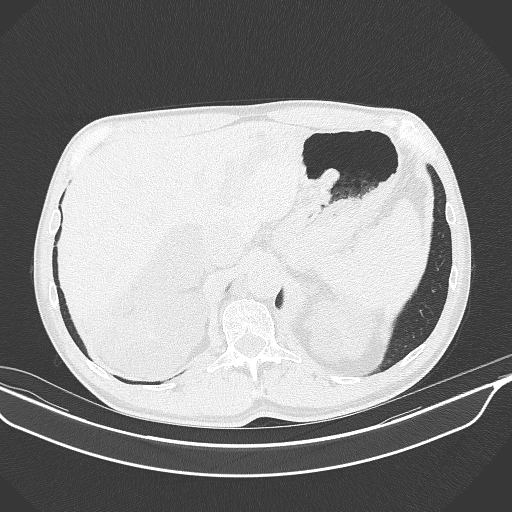
[im 45/180  lung]
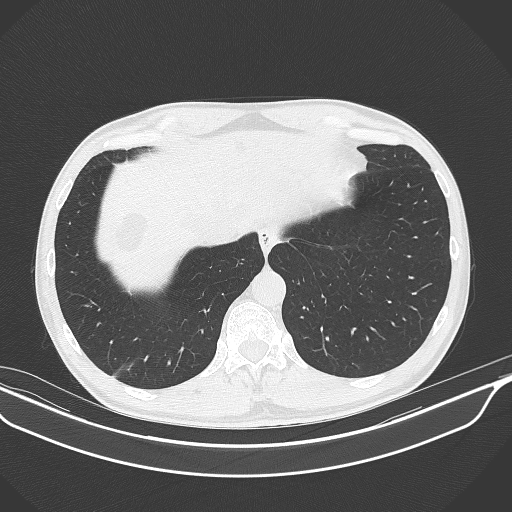
[im 60/180  lung]
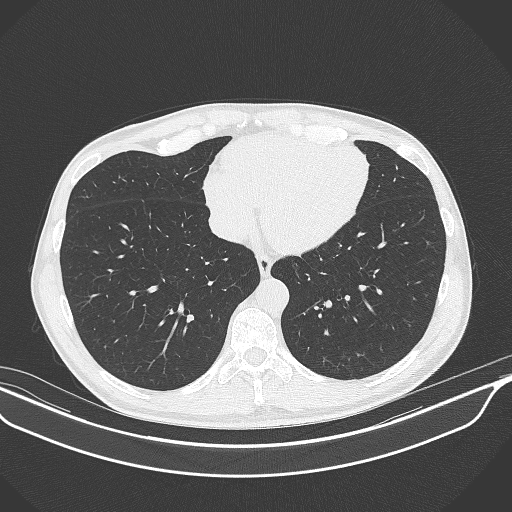
[im 75/180  mediastinal]
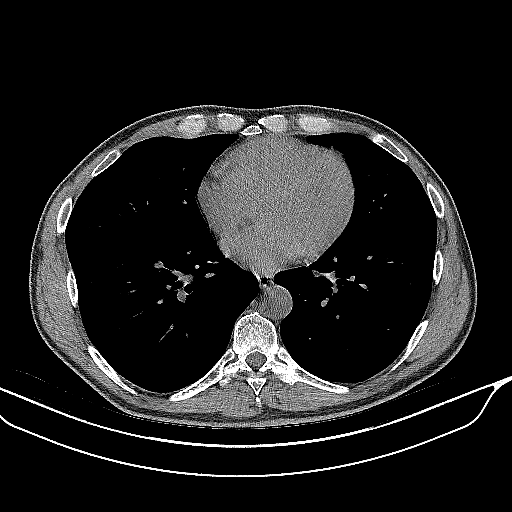
[im 75/180  lung]
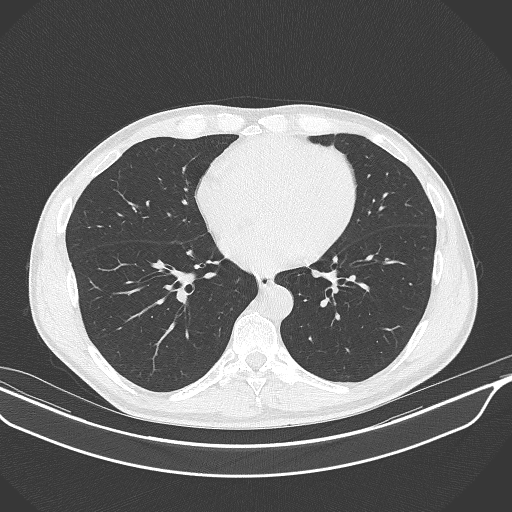
[im 90/180  lung]
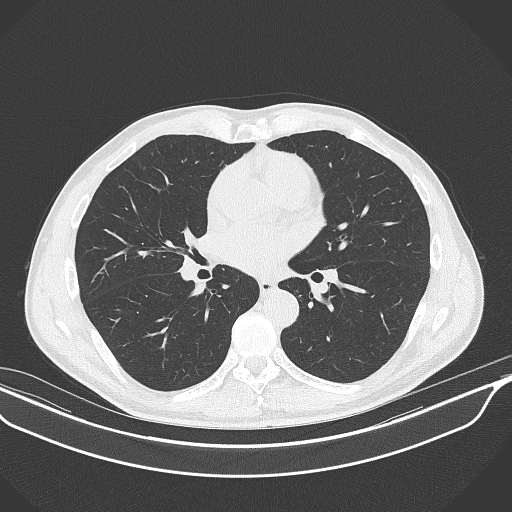
[im 105/180  lung]
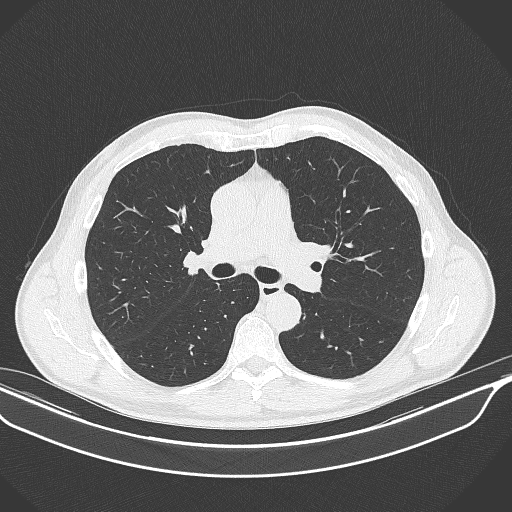
[im 120/180  lung]
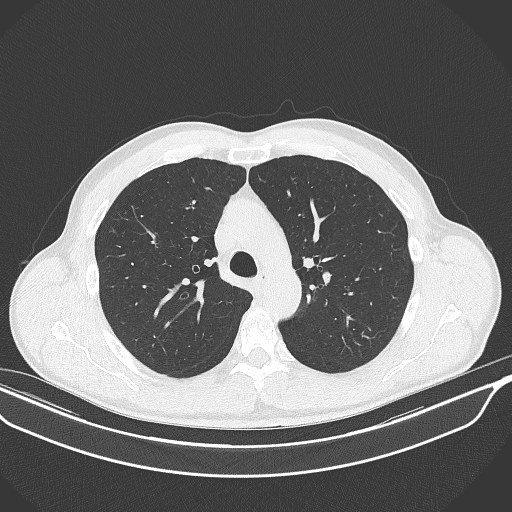
[im 135/180  mediastinal]
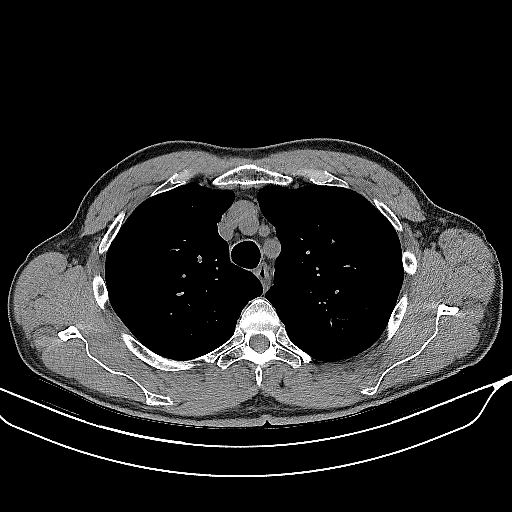
[im 135/180  lung]
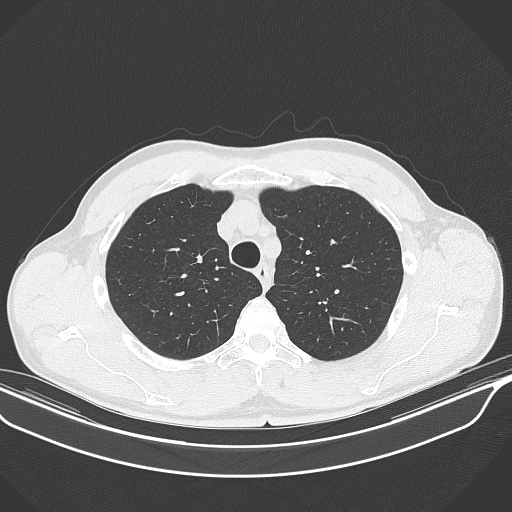
[im 150/180  lung]
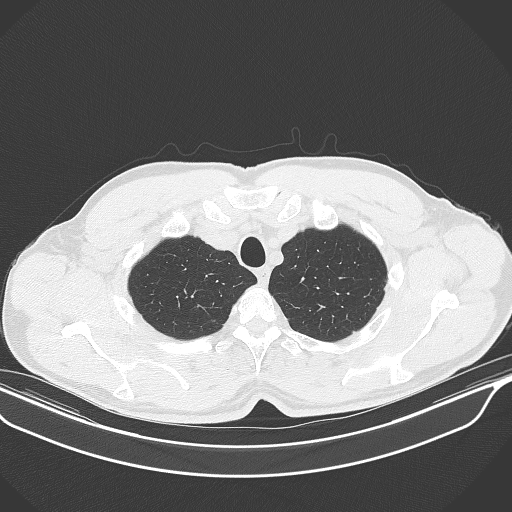
[im 165/180  lung]
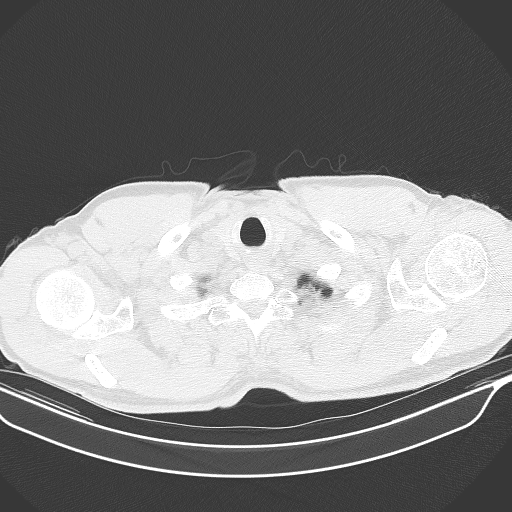

[Series 4: coronal · coronal · 0.71mm/px · 3 of 150 slices shown]
[im 30/150  lung]
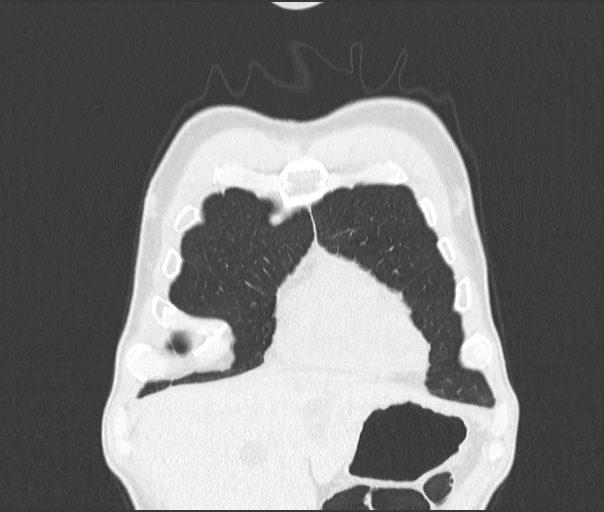
[im 60/150  lung]
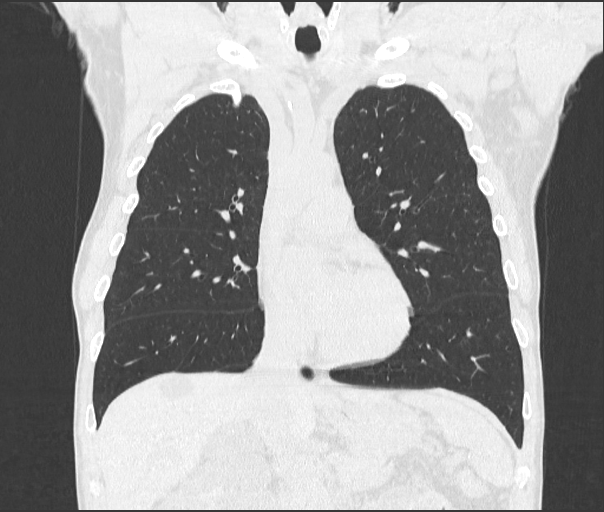
[im 90/150  lung]
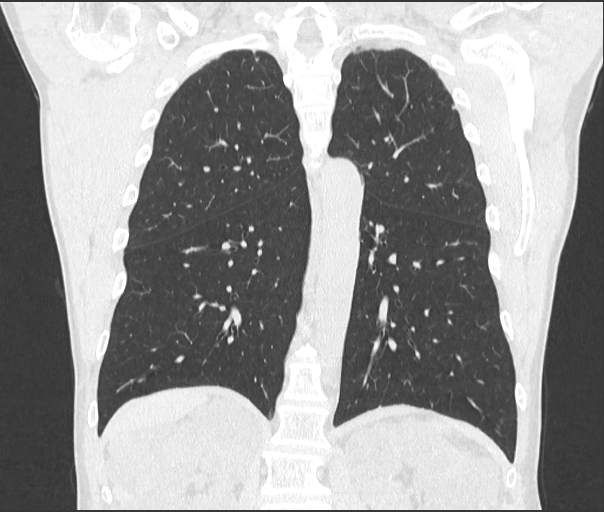

[14 of 36 positions shown; findings below may reference images not displayed]

FINDINGS: LUNGS AND PLEURA:  Scattered calcified and noncalcified micronodules largest 
noncalcified nodule left lower lobe measures 5 mm in the sulcus. No acute 
consolidation No pleural effusion.  
MEDIASTINUM:  No adenopathy. Normal heart size. No pericardial effusion. No 
coronary artery calcifications noted. 
CHEST WALL/AXILLA: No mass or adenopathy.  
UPPER ABDOMEN: Negative. 
MUSCULOSKELETAL: No acute abnormality.
IMPRESSION: Scattered calcified and noncalcified micronodules with the largest nodule in the 
left lower lobe measuring 5 mm. Please refer to the [HOSPITAL] 
recommendations for follow-up below.  
Incidental, multiple solid nodules < 6 mm: 
Low Risk: No Routine follow-up.  
High risk: Optional CT at 12 months.  
NOTE: These recommendations do not apply to patients with immunosuppression or 
patients with known primary cancer.  
REFERENCE: [HOSPITAL] 8935 Guidelines for Management of Incidentally 
Detected Pulmonary Nodules in Adults. Radiology 8935.  
RADIATION DOSE REDUCTION: All CT scans are performed using radiation dose 
reduction techniques, when applicable.  Technical factors are evaluated and 
adjusted to ensure appropriate moderation of exposure.  Automated dose 
management technology is applied to adjust the radiation doses to minimize 
exposure while achieving diagnostic quality images.

## 2022-12-02 IMAGING — MR MRI BRAIN WITH  IAC W/WO CONTRAST
12 of 16 series · 22 of 48 positions shown · IV contrast (gadavist)
Comparison: Brain MRI June 24, 2022.

________________________________________________________________________________________________ 
MRI BRAIN WITH  IAC W/WO CONTRAST,12/02/2022 [DATE]: 
CLINICAL INDICATION: Sensorineural hearing loss involving the left ear since 
4949. Prior nasal surgery. History of prostate cancer.
TECHNIQUE: MRI performed without and with contrast using IAC protocol. 7.0 mL of 
Gadavist were injected intravenously by hand. 0.5 mL was discarded. Patient was 
scanned on a 1.5T magnet.

[Series 102: mpr - smartbrain · axial · 1.1mm · 1.09mm/px · 1 of 2 slices shown]
[im 1/2]
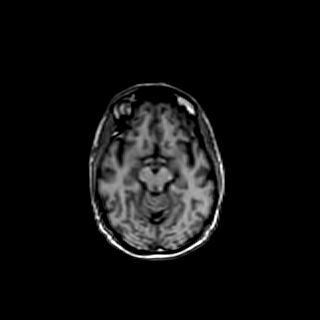

[Series 203: dadc map · axial · 5.0mm · 1.03mm/px · 1 of 27 slices shown (1 of 2)]
[im 1/27]
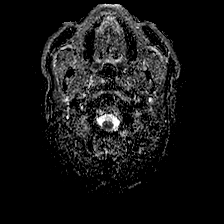

[Series 204: (id) · axial · 5.0mm · 1.03mm/px · 1 of 27 slices shown (1 of 2)]
[im 1/27]
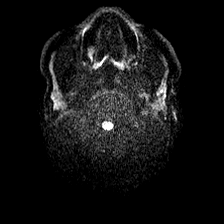

[Series 303: dadc map · coronal · 5.0mm · 0.81mm/px · 1 of 34 slices shown (2 of 2)]
[im 1/34]
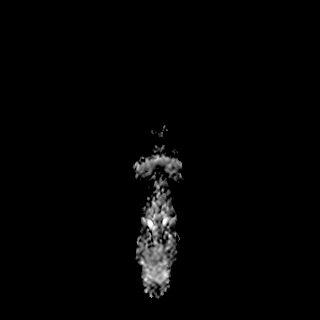

[Series 304: (id) · coronal · 5.0mm · 0.81mm/px · 1 of 34 slices shown (2 of 2)]
[im 1/34]
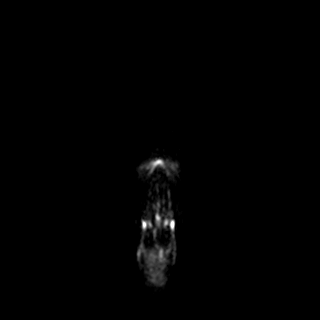

[Series 401: t1_se_sag · sagittal · 4.0mm · 0.43mm/px · 2 of 28 slices shown]
[im 1/28]
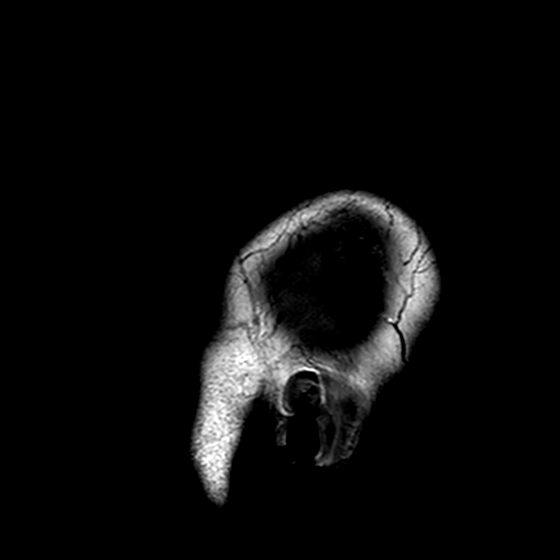
[im 28/28]
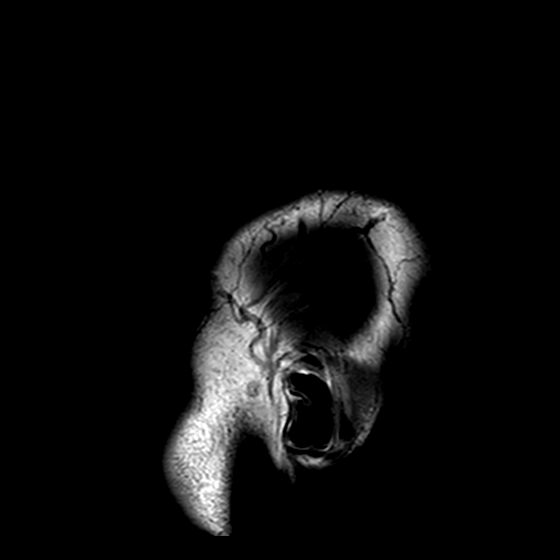

[Series 501: flair_ax+fs · axial · 5.0mm · 0.49mm/px · 1 of 27 slices shown]
[im 1/27]
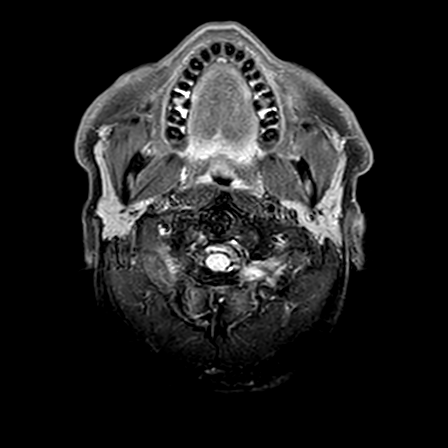

[Series 601: SWI · axial · 3.0mm · 0.40mm/px · z∈[-72,+75]mm · 9 of 200 slices shown]
[im 1/200]
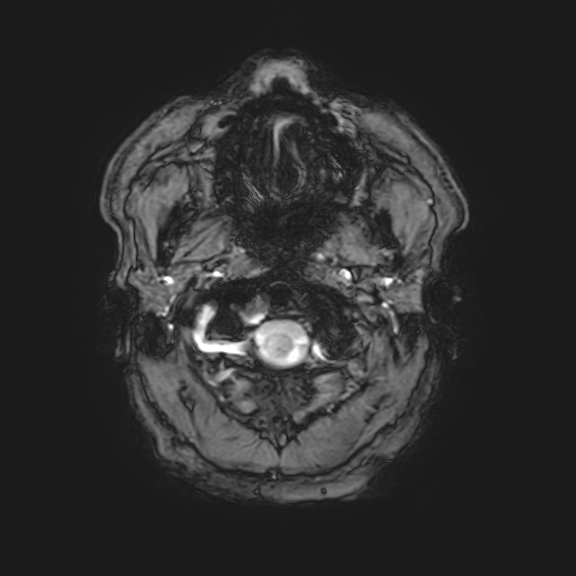
[im 37/200]
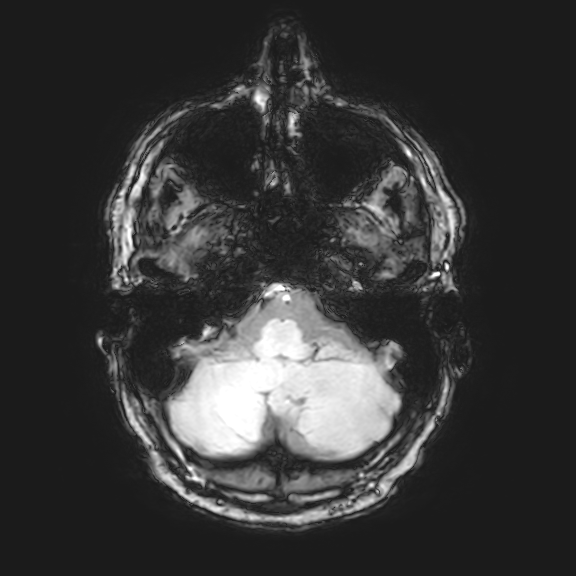
[im 55/200]
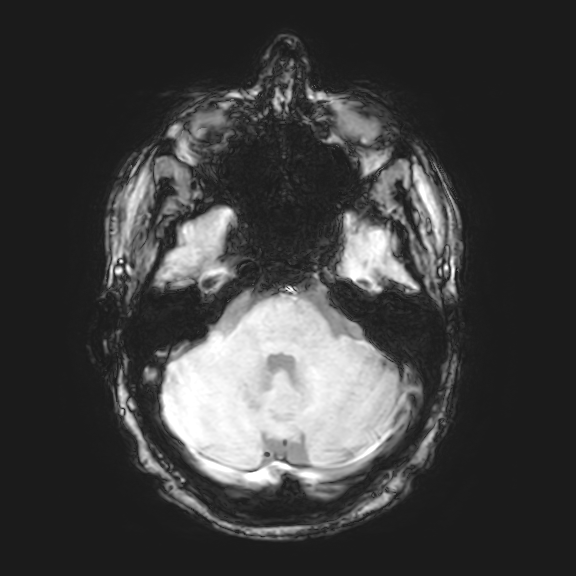
[im 91/200]
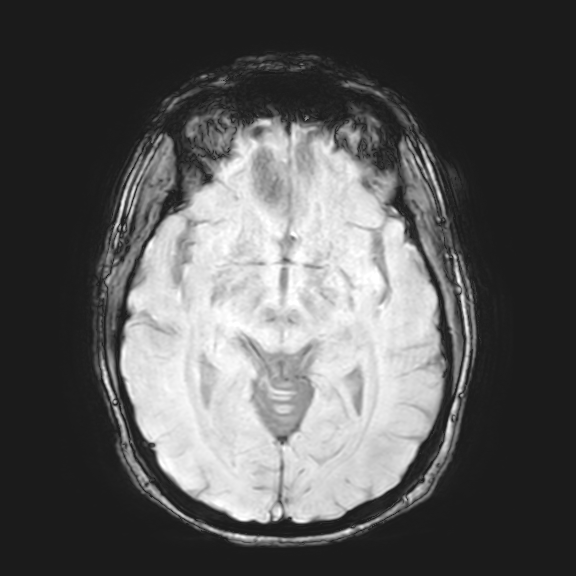
[im 109/200]
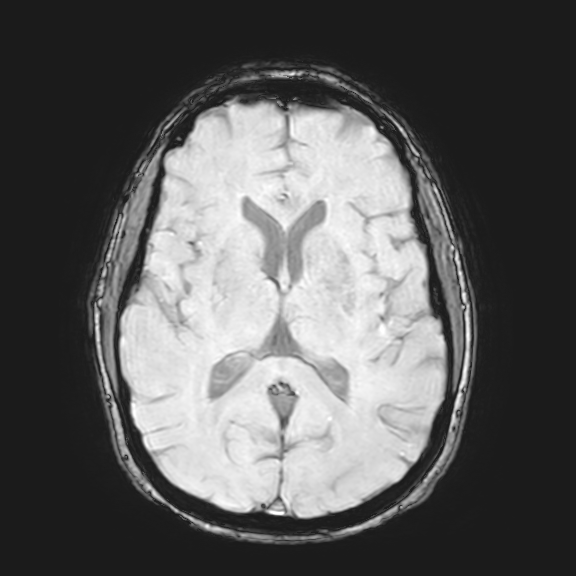
[im 145/200]
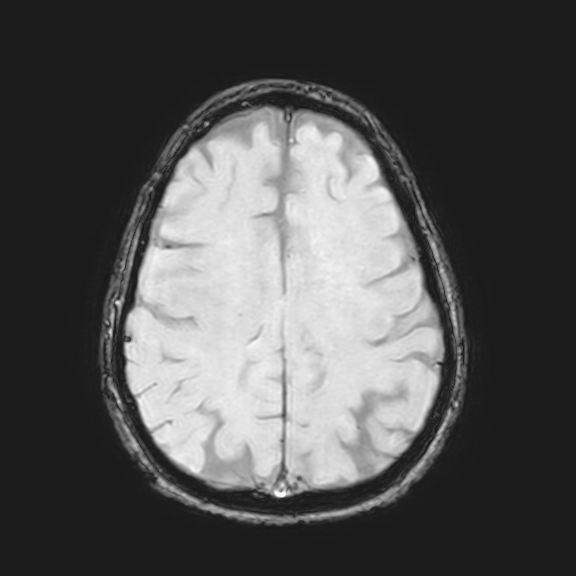
[im 163/200]
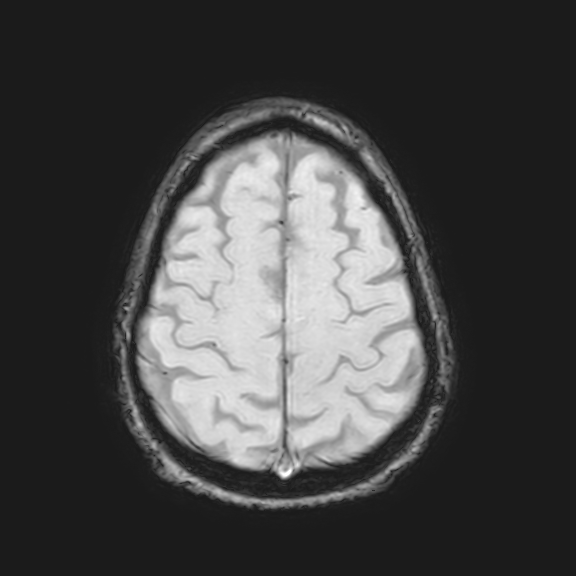
[im 181/200]
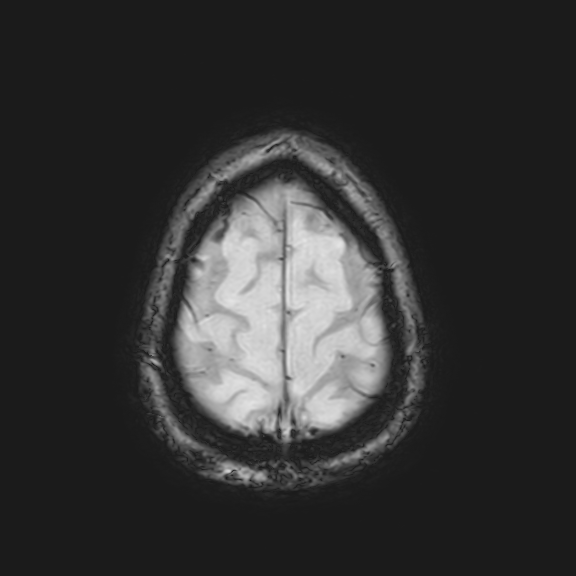
[im 200/200]
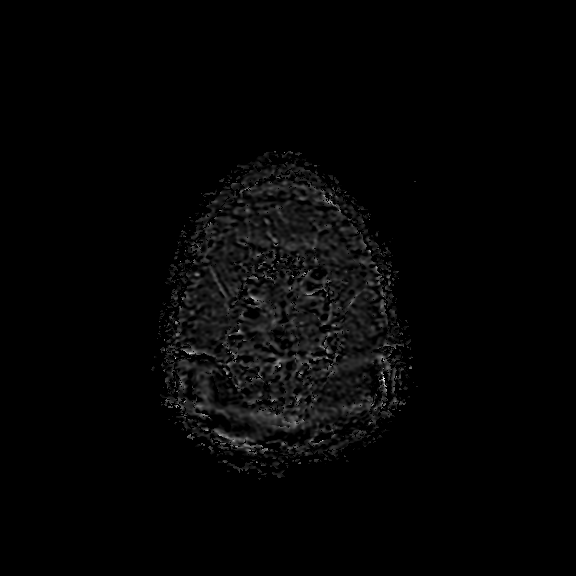

[Series 801: T1 · axial · 3.0mm · 0.59mm/px · 1 of 11 slices shown]
[im 1/11]
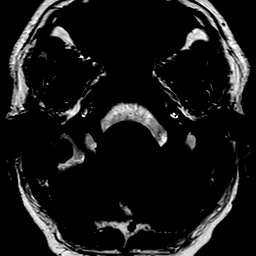

[Series 901: T1 fat-sat post-contrast · axial · 3.0mm · 0.59mm/px · 1 of 11 slices shown (1 of 2)]
[im 1/11]
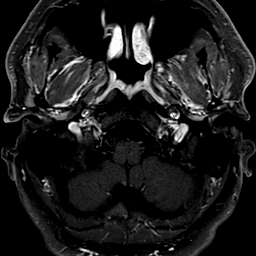

[Series 1001: T1 fat-sat post-contrast · coronal · 3.0mm · 0.59mm/px · 1 of 11 slices shown (2 of 2)]
[im 1/11]
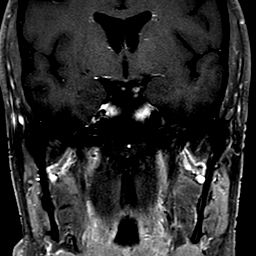

[Series 1101: T1 post-contrast · axial · 5.0mm · 0.51mm/px · z∈[-74,+81]mm · 2 of 27 slices shown]
[im 1/27]
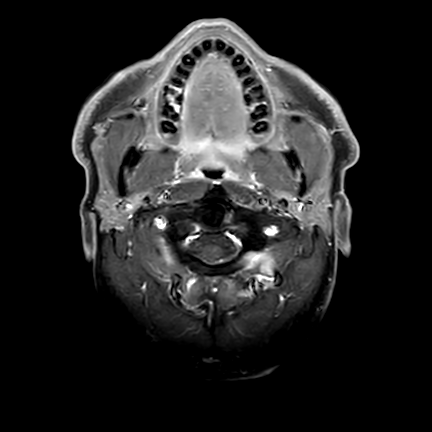
[im 27/27]
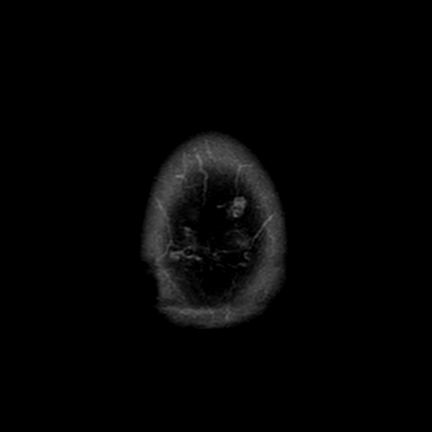

[22 of 48 positions shown; findings below may reference images not displayed]

FINDINGS: -------------------------------------------------------------------------------- 
------ 
INTERNAL AUDITORY CANALS:  
Dedicated imaging of the internal auditory canals demonstrates normal cranial 
nerve VIl and Shalley complexes bilaterally. No cerebellopontine angle or internal 
auditory canal mass. No pathologic cranial nerve or temporal bone contrast 
enhancement. Inner ear structures demonstrate expected fluid signal intensity. 
No middle ear or mastoid effusion. Vascular loop right porus acusticus. No 
labyrinthine hemorrhage. 
-------------------------------------------------------------------------------- 
------ 
INTRACRANIAL:  
Stable minimal nonspecific periventricular and deep white matter T2 FLAIR 
hyperintensity. No mass or abnormal extra-axial fluid collection. Stable 
prominent perivascular spaces at the frontal vertex bilaterally. 
No acute ischemia.  No abnormal foci of susceptibility artifact in the brain. 
Patency of the intracranial vascular flow voids.  No acute intracranial 
hemorrhage, mass effect, midline shift.  No hydrocephalus. Cerebral volume is 
age appropriate. No pathologic contrast enhancement. 
-------------------------------------------------------------------------------- 
------ 
OTHER: 
ORBITS/SINUSES:  Visualized orbits show no acute abnormality or mass.  Mild 
bilateral maxillary sinus membrane thickening. 8 mm hyperintense process 
involving the inferior aspect of the right maxillary sinus is stable to the 
previous study. 
MARROW SIGNAL/SOFT TISSUES: No focal suspect signal abnormality. 
-------------------------------------------------------------------------------- 
------
IMPRESSION: Normal MRI of the internal auditory canals and labyrinthine structures. 
No acute intracranial process with minimal nonspecific, although presumed 
chronic small vessel vascular changes. Graph mild bilateral maxillary sinus 
membrane thickening with possible mucous retention cyst involving the inferior 
aspect right maxillary sinus.
